# Patient Record
Sex: Female | Born: 1935
Health system: Southern US, Community
[De-identification: ages and names within clinical notes are randomized; demographics above are authoritative.]

## PROBLEM LIST (undated history)

## (undated) DIAGNOSIS — S62309A Unspecified fracture of unspecified metacarpal bone, initial encounter for closed fracture: Secondary | ICD-10-CM

## (undated) DIAGNOSIS — D649 Anemia, unspecified: Secondary | ICD-10-CM

## (undated) DIAGNOSIS — I219 Acute myocardial infarction, unspecified: Secondary | ICD-10-CM

## (undated) DIAGNOSIS — K5790 Diverticulosis of intestine, part unspecified, without perforation or abscess without bleeding: Secondary | ICD-10-CM

## (undated) DIAGNOSIS — K219 Gastro-esophageal reflux disease without esophagitis: Secondary | ICD-10-CM

## (undated) DIAGNOSIS — M1990 Unspecified osteoarthritis, unspecified site: Secondary | ICD-10-CM

## (undated) DIAGNOSIS — E785 Hyperlipidemia, unspecified: Secondary | ICD-10-CM

## (undated) DIAGNOSIS — I10 Essential (primary) hypertension: Secondary | ICD-10-CM

## (undated) DIAGNOSIS — C801 Malignant (primary) neoplasm, unspecified: Secondary | ICD-10-CM

## (undated) DIAGNOSIS — H25019 Cortical age-related cataract, unspecified eye: Secondary | ICD-10-CM

## (undated) DIAGNOSIS — I2510 Atherosclerotic heart disease of native coronary artery without angina pectoris: Secondary | ICD-10-CM

## (undated) DIAGNOSIS — I495 Sick sinus syndrome: Secondary | ICD-10-CM

## (undated) DIAGNOSIS — H9190 Unspecified hearing loss, unspecified ear: Secondary | ICD-10-CM

## (undated) DIAGNOSIS — M810 Age-related osteoporosis without current pathological fracture: Secondary | ICD-10-CM

## (undated) HISTORY — PX: CORONARY ANGIOPLASTY: SHX604

## (undated) HISTORY — PX: DILATION AND CURETTAGE OF UTERUS: SHX78

## (undated) HISTORY — PX: ABDOMINAL HYSTERECTOMY: SHX81

## (undated) HISTORY — PX: EYE SURGERY: SHX253

---

## 2004-08-24 ENCOUNTER — Ambulatory Visit: Payer: Self-pay | Admitting: Unknown Physician Specialty

## 2005-06-15 ENCOUNTER — Ambulatory Visit: Payer: Self-pay | Admitting: Internal Medicine

## 2006-04-07 ENCOUNTER — Emergency Department: Payer: Self-pay | Admitting: General Practice

## 2006-06-21 ENCOUNTER — Ambulatory Visit: Payer: Self-pay | Admitting: Internal Medicine

## 2006-08-06 ENCOUNTER — Encounter: Payer: Self-pay | Admitting: Internal Medicine

## 2006-08-16 ENCOUNTER — Encounter: Payer: Self-pay | Admitting: Internal Medicine

## 2007-02-10 ENCOUNTER — Emergency Department: Payer: Self-pay | Admitting: Internal Medicine

## 2007-02-10 ENCOUNTER — Other Ambulatory Visit: Payer: Self-pay

## 2007-11-13 ENCOUNTER — Inpatient Hospital Stay: Payer: Self-pay | Admitting: Specialist

## 2007-11-13 ENCOUNTER — Other Ambulatory Visit: Payer: Self-pay

## 2007-11-14 ENCOUNTER — Other Ambulatory Visit: Payer: Self-pay

## 2008-01-02 ENCOUNTER — Ambulatory Visit: Payer: Self-pay | Admitting: Family Medicine

## 2008-07-15 ENCOUNTER — Ambulatory Visit: Payer: Self-pay | Admitting: Family Medicine

## 2009-09-24 ENCOUNTER — Ambulatory Visit: Payer: Self-pay | Admitting: Family Medicine

## 2010-09-04 ENCOUNTER — Emergency Department: Payer: Self-pay | Admitting: Emergency Medicine

## 2011-03-01 ENCOUNTER — Ambulatory Visit: Payer: Self-pay | Admitting: Family Medicine

## 2012-03-20 ENCOUNTER — Ambulatory Visit: Payer: Self-pay | Admitting: Family Medicine

## 2013-05-29 ENCOUNTER — Ambulatory Visit: Payer: Self-pay | Admitting: Family Medicine

## 2014-07-29 ENCOUNTER — Ambulatory Visit: Payer: Self-pay | Admitting: Family Medicine

## 2015-07-20 ENCOUNTER — Other Ambulatory Visit: Payer: Self-pay | Admitting: Family Medicine

## 2015-07-20 DIAGNOSIS — Z1231 Encounter for screening mammogram for malignant neoplasm of breast: Secondary | ICD-10-CM

## 2015-08-03 ENCOUNTER — Ambulatory Visit
Admission: RE | Admit: 2015-08-03 | Discharge: 2015-08-03 | Disposition: A | Discharge status: Home / Self Care | Payer: Medicare Other | Source: Ambulatory Visit | Attending: Family Medicine | Admitting: Family Medicine

## 2015-08-03 DIAGNOSIS — Z1231 Encounter for screening mammogram for malignant neoplasm of breast: Secondary | ICD-10-CM | POA: Diagnosis present

## 2015-08-03 HISTORY — DX: Malignant (primary) neoplasm, unspecified: C80.1

## 2015-11-08 DIAGNOSIS — L820 Inflamed seborrheic keratosis: Secondary | ICD-10-CM | POA: Diagnosis not present

## 2015-11-08 DIAGNOSIS — L578 Other skin changes due to chronic exposure to nonionizing radiation: Secondary | ICD-10-CM | POA: Diagnosis not present

## 2015-11-08 DIAGNOSIS — L570 Actinic keratosis: Secondary | ICD-10-CM | POA: Diagnosis not present

## 2015-11-08 DIAGNOSIS — L821 Other seborrheic keratosis: Secondary | ICD-10-CM | POA: Diagnosis not present

## 2016-05-30 DIAGNOSIS — H2513 Age-related nuclear cataract, bilateral: Secondary | ICD-10-CM | POA: Diagnosis not present

## 2016-07-03 DIAGNOSIS — H2513 Age-related nuclear cataract, bilateral: Secondary | ICD-10-CM | POA: Diagnosis not present

## 2016-07-04 ENCOUNTER — Encounter: Payer: Self-pay | Admitting: *Deleted

## 2016-07-06 DIAGNOSIS — H04123 Dry eye syndrome of bilateral lacrimal glands: Secondary | ICD-10-CM | POA: Diagnosis not present

## 2016-07-10 ENCOUNTER — Encounter: Admission: RE | Payer: Self-pay | Source: Ambulatory Visit

## 2016-07-10 ENCOUNTER — Ambulatory Visit
Admission: RE | Admit: 2016-07-10 | Payer: Commercial Managed Care - HMO | Source: Ambulatory Visit | Admitting: Ophthalmology

## 2016-07-10 HISTORY — DX: Acute myocardial infarction, unspecified: I21.9

## 2016-07-10 HISTORY — DX: Atherosclerotic heart disease of native coronary artery without angina pectoris: I25.10

## 2016-07-10 HISTORY — DX: Essential (primary) hypertension: I10

## 2016-07-10 HISTORY — DX: Anemia, unspecified: D64.9

## 2016-07-10 SURGERY — PHACOEMULSIFICATION, CATARACT, WITH IOL INSERTION
Anesthesia: Choice | Laterality: Left

## 2016-07-28 DIAGNOSIS — H16123 Filamentary keratitis, bilateral: Secondary | ICD-10-CM | POA: Diagnosis not present

## 2016-08-21 ENCOUNTER — Other Ambulatory Visit: Payer: Self-pay | Admitting: Family Medicine

## 2016-08-21 DIAGNOSIS — K219 Gastro-esophageal reflux disease without esophagitis: Secondary | ICD-10-CM | POA: Diagnosis not present

## 2016-08-21 DIAGNOSIS — E782 Mixed hyperlipidemia: Secondary | ICD-10-CM | POA: Diagnosis not present

## 2016-08-21 DIAGNOSIS — Z1231 Encounter for screening mammogram for malignant neoplasm of breast: Secondary | ICD-10-CM | POA: Diagnosis not present

## 2016-08-21 DIAGNOSIS — I10 Essential (primary) hypertension: Secondary | ICD-10-CM | POA: Diagnosis not present

## 2016-08-21 DIAGNOSIS — Z23 Encounter for immunization: Secondary | ICD-10-CM | POA: Diagnosis not present

## 2016-08-28 DIAGNOSIS — H01003 Unspecified blepharitis right eye, unspecified eyelid: Secondary | ICD-10-CM | POA: Diagnosis not present

## 2016-09-04 ENCOUNTER — Ambulatory Visit
Admission: RE | Admit: 2016-09-04 | Discharge: 2016-09-04 | Disposition: A | Discharge status: Home / Self Care | Payer: Commercial Managed Care - HMO | Source: Ambulatory Visit | Attending: Family Medicine | Admitting: Family Medicine

## 2016-09-04 DIAGNOSIS — Z1231 Encounter for screening mammogram for malignant neoplasm of breast: Secondary | ICD-10-CM | POA: Insufficient documentation

## 2016-09-18 DIAGNOSIS — H16123 Filamentary keratitis, bilateral: Secondary | ICD-10-CM | POA: Diagnosis not present

## 2016-11-30 DIAGNOSIS — H04123 Dry eye syndrome of bilateral lacrimal glands: Secondary | ICD-10-CM | POA: Diagnosis not present

## 2016-12-08 DIAGNOSIS — H16123 Filamentary keratitis, bilateral: Secondary | ICD-10-CM | POA: Diagnosis not present

## 2017-01-01 DIAGNOSIS — H16223 Keratoconjunctivitis sicca, not specified as Sjogren's, bilateral: Secondary | ICD-10-CM | POA: Diagnosis not present

## 2017-02-09 DIAGNOSIS — H2513 Age-related nuclear cataract, bilateral: Secondary | ICD-10-CM | POA: Diagnosis not present

## 2017-02-20 DIAGNOSIS — J0100 Acute maxillary sinusitis, unspecified: Secondary | ICD-10-CM | POA: Diagnosis not present

## 2017-02-20 DIAGNOSIS — I10 Essential (primary) hypertension: Secondary | ICD-10-CM | POA: Diagnosis not present

## 2017-02-27 DIAGNOSIS — H2513 Age-related nuclear cataract, bilateral: Secondary | ICD-10-CM | POA: Diagnosis not present

## 2017-03-08 ENCOUNTER — Encounter: Payer: Self-pay | Admitting: *Deleted

## 2017-03-15 ENCOUNTER — Encounter: Payer: Self-pay | Admitting: *Deleted

## 2017-03-15 ENCOUNTER — Ambulatory Visit: Payer: Medicare HMO | Admitting: Anesthesiology

## 2017-03-15 ENCOUNTER — Ambulatory Visit
Admission: RE | Admit: 2017-03-15 | Discharge: 2017-03-15 | Disposition: A | Discharge status: Home / Self Care | Payer: Medicare HMO | Source: Ambulatory Visit | Attending: Ophthalmology | Admitting: Ophthalmology

## 2017-03-15 ENCOUNTER — Encounter
Admission: RE | Disposition: A | Discharge status: Home / Self Care | Payer: Self-pay | Source: Ambulatory Visit | Attending: Ophthalmology

## 2017-03-15 DIAGNOSIS — I252 Old myocardial infarction: Secondary | ICD-10-CM | POA: Insufficient documentation

## 2017-03-15 DIAGNOSIS — I10 Essential (primary) hypertension: Secondary | ICD-10-CM | POA: Insufficient documentation

## 2017-03-15 DIAGNOSIS — Z79899 Other long term (current) drug therapy: Secondary | ICD-10-CM | POA: Diagnosis not present

## 2017-03-15 DIAGNOSIS — H2513 Age-related nuclear cataract, bilateral: Secondary | ICD-10-CM | POA: Diagnosis not present

## 2017-03-15 DIAGNOSIS — I2510 Atherosclerotic heart disease of native coronary artery without angina pectoris: Secondary | ICD-10-CM | POA: Diagnosis not present

## 2017-03-15 DIAGNOSIS — E7800 Pure hypercholesterolemia, unspecified: Secondary | ICD-10-CM | POA: Diagnosis not present

## 2017-03-15 DIAGNOSIS — Z7982 Long term (current) use of aspirin: Secondary | ICD-10-CM | POA: Insufficient documentation

## 2017-03-15 DIAGNOSIS — H2512 Age-related nuclear cataract, left eye: Secondary | ICD-10-CM | POA: Insufficient documentation

## 2017-03-15 DIAGNOSIS — Z955 Presence of coronary angioplasty implant and graft: Secondary | ICD-10-CM | POA: Diagnosis not present

## 2017-03-15 DIAGNOSIS — D649 Anemia, unspecified: Secondary | ICD-10-CM | POA: Diagnosis not present

## 2017-03-15 HISTORY — DX: Unspecified hearing loss, unspecified ear: H91.90

## 2017-03-15 HISTORY — PX: CATARACT EXTRACTION W/PHACO: SHX586

## 2017-03-15 HISTORY — DX: Unspecified osteoarthritis, unspecified site: M19.90

## 2017-03-15 SURGERY — PHACOEMULSIFICATION, CATARACT, WITH IOL INSERTION
Anesthesia: Monitor Anesthesia Care | Site: Eye | Laterality: Left | Wound class: Clean

## 2017-03-15 MED ORDER — MIDAZOLAM HCL 2 MG/2ML IJ SOLN
INTRAMUSCULAR | Status: DC | PRN
  Administered 2017-03-15: 1 mg via INTRAVENOUS

## 2017-03-15 MED ORDER — SODIUM HYALURONATE 10 MG/ML IO SOLN
INTRAOCULAR | Status: AC
  Filled 2017-03-15: qty 0.85

## 2017-03-15 MED ORDER — LIDOCAINE HCL (PF) 4 % IJ SOLN
INTRAOCULAR | Status: DC | PRN
  Administered 2017-03-15: 4 mL via OPHTHALMIC

## 2017-03-15 MED ORDER — FENTANYL CITRATE (PF) 100 MCG/2ML IJ SOLN: 25.0000 ug | INTRAMUSCULAR | Status: DC | PRN

## 2017-03-15 MED ORDER — CARBACHOL 0.01 % IO SOLN
INTRAOCULAR | Status: DC | PRN
  Administered 2017-03-15: 0.5 mL via INTRAOCULAR

## 2017-03-15 MED ORDER — ARMC OPHTHALMIC DILATING DROPS
1.0000 "application " | OPHTHALMIC | Status: AC
  Administered 2017-03-15 (×2): 1 via OPHTHALMIC

## 2017-03-15 MED ORDER — TRYPAN BLUE 0.06 % OP SOLN
OPHTHALMIC | Status: DC | PRN
  Administered 2017-03-15: 0.5 mL via INTRAOCULAR

## 2017-03-15 MED ORDER — MOXIFLOXACIN HCL 0.5 % OP SOLN
OPHTHALMIC | Status: AC
  Filled 2017-03-15: qty 3

## 2017-03-15 MED ORDER — MOXIFLOXACIN HCL 0.5 % OP SOLN: 1.0000 [drp] | OPHTHALMIC | Status: DC | PRN

## 2017-03-15 MED ORDER — SODIUM HYALURONATE 23 MG/ML IO SOLN
INTRAOCULAR | Status: DC | PRN
  Administered 2017-03-15: 0.6 mL via INTRAOCULAR

## 2017-03-15 MED ORDER — POVIDONE-IODINE 5 % OP SOLN
OPHTHALMIC | Status: DC | PRN
  Administered 2017-03-15: 1 via OPHTHALMIC

## 2017-03-15 MED ORDER — POVIDONE-IODINE 5 % OP SOLN
OPHTHALMIC | Status: AC
  Filled 2017-03-15: qty 30

## 2017-03-15 MED ORDER — SODIUM HYALURONATE 10 MG/ML IO SOLN
INTRAOCULAR | Status: DC | PRN
  Administered 2017-03-15: 0.85 mL via INTRAOCULAR

## 2017-03-15 MED ORDER — SODIUM CHLORIDE 0.9 % IV SOLN
INTRAVENOUS | Status: DC
  Administered 2017-03-15: 09:00:00 via INTRAVENOUS

## 2017-03-15 MED ORDER — ONDANSETRON HCL 4 MG/2ML IJ SOLN: 4.0000 mg | Freq: Once | INTRAMUSCULAR | Status: DC | PRN

## 2017-03-15 MED ORDER — MOXIFLOXACIN HCL 0.5 % OP SOLN
OPHTHALMIC | Status: DC | PRN
  Administered 2017-03-15: 1 [drp] via OPHTHALMIC

## 2017-03-15 MED ORDER — SODIUM HYALURONATE 23 MG/ML IO SOLN
INTRAOCULAR | Status: AC
  Filled 2017-03-15: qty 0.6

## 2017-03-15 MED ORDER — EPINEPHRINE PF 1 MG/ML IJ SOLN
INTRAOCULAR | Status: DC | PRN
  Administered 2017-03-15: 200 mL via OPHTHALMIC

## 2017-03-15 MED ORDER — EPINEPHRINE PF 1 MG/ML IJ SOLN
INTRAMUSCULAR | Status: AC
  Filled 2017-03-15: qty 2

## 2017-03-15 MED ORDER — ARMC OPHTHALMIC DILATING DROPS
OPHTHALMIC | Status: AC
  Filled 2017-03-15: qty 0.4

## 2017-03-15 MED ORDER — LIDOCAINE HCL (PF) 2 % IJ SOLN
INTRAMUSCULAR | Status: AC
  Filled 2017-03-15: qty 4

## 2017-03-15 MED ORDER — FENTANYL CITRATE (PF) 100 MCG/2ML IJ SOLN
INTRAMUSCULAR | Status: AC
  Filled 2017-03-15: qty 2

## 2017-03-15 MED ORDER — MIDAZOLAM HCL 2 MG/2ML IJ SOLN
INTRAMUSCULAR | Status: AC
  Filled 2017-03-15: qty 2

## 2017-03-15 SURGICAL SUPPLY — 15 items
DISSECTOR HYDRO NUCLEUS 50X22 (MISCELLANEOUS) ×3 IMPLANT
GLOVE BIO SURGEON STRL SZ8 (GLOVE) ×3 IMPLANT
GLOVE BIOGEL M 6.5 STRL (GLOVE) ×3 IMPLANT
GLOVE SURG LX 7.5 STRW (GLOVE) ×2
GLOVE SURG LX STRL 7.5 STRW (GLOVE) ×1 IMPLANT
GOWN STRL REUS W/ TWL LRG LVL3 (GOWN DISPOSABLE) ×2 IMPLANT
GOWN STRL REUS W/TWL LRG LVL3 (GOWN DISPOSABLE) ×4
LENS IOL TECNIS ITEC 26.5 (Intraocular Lens) ×3 IMPLANT
PACK CATARACT (MISCELLANEOUS) ×3 IMPLANT
PACK CATARACT KING (MISCELLANEOUS) ×3 IMPLANT
PACK EYE AFTER SURG (MISCELLANEOUS) ×3 IMPLANT
SOL BSS BAG (MISCELLANEOUS) ×3
SOLUTION BSS BAG (MISCELLANEOUS) ×1 IMPLANT
WATER STERILE IRR 250ML POUR (IV SOLUTION) ×3 IMPLANT
WIPE NON LINTING 3.25X3.25 (MISCELLANEOUS) ×3 IMPLANT

## 2017-03-15 NOTE — Anesthesia Postprocedure Evaluation (Signed)
Anesthesia Post Note  Patient: Lisa Gonzales  Procedure(s) Performed: Procedure(s) (LRB): CATARACT EXTRACTION PHACO AND INTRAOCULAR LENS PLACEMENT (IOC) (Left)  Patient location during evaluation: PACU Anesthesia Type: MAC Level of consciousness: awake and alert Pain management: pain level controlled Vital Signs Assessment: post-procedure vital signs reviewed and stable Respiratory status: spontaneous breathing, nonlabored ventilation, respiratory function stable and patient connected to nasal cannula oxygen Cardiovascular status: stable and blood pressure returned to baseline Anesthetic complications: no     Last Vitals:  Vitals:   03/15/17 0844 03/15/17 0845  BP: (!) 169/78   Pulse: (!) 52   Resp: 18   Temp: 36.7 C 36.9 C    Last Pain:  Vitals:   03/15/17 0845  TempSrc: Oral                 Nadene Rubins

## 2017-03-15 NOTE — Transfer of Care (Signed)
Immediate Anesthesia Transfer of Care Note  Patient: Lisa Gonzales  Procedure(s) Performed: Procedure(s) with comments: CATARACT EXTRACTION PHACO AND INTRAOCULAR LENS PLACEMENT (IOC) (Left) - US00:35.5 AP8.6 CDE3.06 FLUID LOT # [id number]  Patient Location: PACU  Anesthesia Type:MAC  Level of Consciousness: awake, alert  and oriented  Airway & Oxygen Therapy: Patient Spontanous Breathing  Post-op Assessment: Report given to RN and Post -op Vital signs reviewed and stable  Post vital signs: Reviewed and stable  Last Vitals:  Vitals:   03/15/17 0844 03/15/17 0845  BP: (!) 169/78   Pulse: (!) 52   Resp: 18   Temp: 36.7 C 36.9 C    Last Pain:  Vitals:   03/15/17 0845  TempSrc: Oral         Complications: No apparent anesthesia complications

## 2017-03-15 NOTE — Op Note (Signed)
OPERATIVE NOTE  Lisa Gonzales [id number] 03/15/2017   PREOPERATIVE DIAGNOSIS:  Nuclear sclerotic cataract left eye.  H25.12   POSTOPERATIVE DIAGNOSIS:    Nuclear sclerotic cataract left eye.     PROCEDURE:  Phacoemusification with posterior chamber intraocular lens placement of the left eye   LENS:   Implant Name Type Inv. Item Serial No. Manufacturer Lot No. LRB No. Used  LENS IOL DIOP 26.5 - [id number] Intraocular Lens LENS IOL DIOP 26.5 [id number]   Left 1       PCB00 +26.5   ULTRASOUND TIME: 0 minutes 35 seconds.  CDE 3.06   SURGEON:  Benay Pillow, MD, MPH   ANESTHESIA:  Topical with tetracaine drops augmented with 1% preservative-free intracameral lidocaine.  ESTIMATED BLOOD LOSS: <1 mL   COMPLICATIONS:  None.   DESCRIPTION OF PROCEDURE:  The patient was identified in the holding room and transported to the operating room and placed in the supine position under the operating microscope.  The left eye was identified as the operative eye and it was prepped and draped in the usual sterile ophthalmic fashion.   A 1.0 millimeter clear-corneal paracentesis was made at the 5:00 position. 0.5 ml of preservative-free 1% lidocaine with epinephrine was injected into the anterior chamber.  The anterior chamber was filled with Healon 5 viscoelastic.  A 2.4 millimeter keratome was used to make a near-clear corneal incision at the 2:00 position.  A curvilinear capsulorrhexis was made with a cystotome and capsulorrhexis forceps.  Balanced salt solution was used to hydrodissect and hydrodelineate the nucleus.   Phacoemulsification was then used in stop and chop fashion to remove the lens nucleus and epinucleus.  The remaining cortex was then removed using the irrigation and aspiration handpiece. Healon was then placed into the capsular bag to distend it for lens placement.  A lens was then injected into the capsular bag.  The remaining viscoelastic was aspirated.   Wounds were  hydrated with balanced salt solution.  The anterior chamber was inflated to a physiologic pressure with balanced salt solution.  Intracameral vigamox 0.1 mL undiltued was injected into the eye and a drop placed onto the ocular surface.  No wound leaks were noted.  The patient was taken to the recovery room in stable condition without complications of anesthesia or surgery  Benay Pillow 03/15/2017, 10:34 AM

## 2017-03-15 NOTE — Anesthesia Post-op Follow-up Note (Cosign Needed)
Anesthesia QCDR form completed.        

## 2017-03-15 NOTE — Discharge Instructions (Signed)
Eye Surgery Discharge Instructions  Expect mild scratchy sensation or mild soreness. DO NOT RUB YOUR EYE!  The day of surgery:  Minimal physical activity, but bed rest is not required  No reading, computer work, or close hand work  No bending, lifting, or straining.  May watch TV  For 24 hours:  No driving, legal decisions, or alcoholic beverages  Safety precautions  Eat anything you prefer: It is better to start with liquids, then soup then solid foods.  _____ Eye patch should be worn until postoperative exam tomorrow.  ____ Solar shield eyeglasses should be worn for comfort in the sunlight/patch while sleeping  Resume all regular medications including aspirin or Coumadin if these were discontinued prior to surgery. You may shower, bathe, shave, or wash your hair. Tylenol may be taken for mild discomfort.  Call your doctor if you experience significant pain, nausea, or vomiting, fever > 101 or other signs of infection. [phone] or [phone] Specific instructions:  Follow-up Information    Eulogio Bear, MD Follow up on 03/16/2017.   Specialty:  Ophthalmology Why:  9:25 Contact information: [address] [place] [address] [phone]

## 2017-03-15 NOTE — H&P (Signed)
The History and Physical notes are on paper, have been signed, and are to be scanned.   I have examined the patient and there are no changes to the H&P.   Benay Pillow 03/15/2017 9:59 AM

## 2017-03-15 NOTE — Anesthesia Preprocedure Evaluation (Addendum)
Anesthesia Evaluation  Patient identified by MRN, date of birth, ID band Patient awake    Reviewed: Allergy & Precautions, NPO status , Patient's Chart, lab work & pertinent test results, reviewed documented beta blocker date and time   Airway Mallampati: III  TM Distance: <3 FB     Dental  (+) Lower Dentures   Pulmonary neg pulmonary ROS,    Pulmonary exam normal        Cardiovascular hypertension, Pt. on medications and Pt. on home beta blockers + CAD and + Past MI  Normal cardiovascular exam     Neuro/Psych negative neurological ROS  negative psych ROS   GI/Hepatic negative GI ROS, Neg liver ROS,   Endo/Other  negative endocrine ROS  Renal/GU negative Renal ROS     Musculoskeletal  (+) Arthritis , Osteoarthritis,    Abdominal Normal abdominal exam  (+)   Peds negative pediatric ROS (+)  Hematology  (+) anemia ,   Anesthesia Other Findings Past Medical History: No date: Anemia No date: Arthritis No date: Cancer (HCC)     Comment: skin ca No date: Coronary artery disease No date: HOH (hard of hearing)     Comment: AIDS No date: Hypertension No date: Myocardial infarction [facility])     Comment: 2009  Reproductive/Obstetrics                            Anesthesia Physical Anesthesia Plan  ASA: III  Anesthesia Plan: MAC   Post-op Pain Management:    Induction: Intravenous  Airway Management Planned: Nasal Cannula  Additional Equipment:   Intra-op Plan:   Post-operative Plan:   Informed Consent: I have reviewed the patients History and Physical, chart, labs and discussed the procedure including the risks, benefits and alternatives for the proposed anesthesia with the patient or authorized representative who has indicated his/her understanding and acceptance.   Dental advisory given  Plan Discussed with: CRNA and Surgeon  Anesthesia Plan Comments:         Anesthesia  Quick Evaluation

## 2017-03-30 DIAGNOSIS — R69 Illness, unspecified: Secondary | ICD-10-CM | POA: Diagnosis not present

## 2017-04-05 DIAGNOSIS — R69 Illness, unspecified: Secondary | ICD-10-CM | POA: Diagnosis not present

## 2017-04-05 DIAGNOSIS — K006 Disturbances in tooth eruption: Secondary | ICD-10-CM | POA: Diagnosis not present

## 2017-04-26 DIAGNOSIS — H2511 Age-related nuclear cataract, right eye: Secondary | ICD-10-CM | POA: Diagnosis not present

## 2017-05-02 ENCOUNTER — Encounter: Payer: Self-pay | Admitting: *Deleted

## 2017-05-10 ENCOUNTER — Ambulatory Visit: Payer: Medicare HMO | Admitting: Anesthesiology

## 2017-05-10 ENCOUNTER — Encounter
Admission: RE | Disposition: A | Discharge status: Home / Self Care | Payer: Self-pay | Source: Ambulatory Visit | Attending: Ophthalmology

## 2017-05-10 ENCOUNTER — Ambulatory Visit
Admission: RE | Admit: 2017-05-10 | Discharge: 2017-05-10 | Disposition: A | Discharge status: Home / Self Care | Payer: Medicare HMO | Source: Ambulatory Visit | Attending: Ophthalmology | Admitting: Ophthalmology

## 2017-05-10 ENCOUNTER — Encounter: Payer: Self-pay | Admitting: *Deleted

## 2017-05-10 DIAGNOSIS — M1990 Unspecified osteoarthritis, unspecified site: Secondary | ICD-10-CM | POA: Insufficient documentation

## 2017-05-10 DIAGNOSIS — I2510 Atherosclerotic heart disease of native coronary artery without angina pectoris: Secondary | ICD-10-CM | POA: Diagnosis not present

## 2017-05-10 DIAGNOSIS — Z955 Presence of coronary angioplasty implant and graft: Secondary | ICD-10-CM | POA: Insufficient documentation

## 2017-05-10 DIAGNOSIS — E7800 Pure hypercholesterolemia, unspecified: Secondary | ICD-10-CM | POA: Insufficient documentation

## 2017-05-10 DIAGNOSIS — I10 Essential (primary) hypertension: Secondary | ICD-10-CM | POA: Diagnosis not present

## 2017-05-10 DIAGNOSIS — Z7982 Long term (current) use of aspirin: Secondary | ICD-10-CM | POA: Diagnosis not present

## 2017-05-10 DIAGNOSIS — H2511 Age-related nuclear cataract, right eye: Secondary | ICD-10-CM | POA: Diagnosis not present

## 2017-05-10 DIAGNOSIS — I252 Old myocardial infarction: Secondary | ICD-10-CM | POA: Insufficient documentation

## 2017-05-10 DIAGNOSIS — Z85828 Personal history of other malignant neoplasm of skin: Secondary | ICD-10-CM | POA: Insufficient documentation

## 2017-05-10 DIAGNOSIS — Z79899 Other long term (current) drug therapy: Secondary | ICD-10-CM | POA: Insufficient documentation

## 2017-05-10 HISTORY — PX: CATARACT EXTRACTION W/PHACO: SHX586

## 2017-05-10 SURGERY — PHACOEMULSIFICATION, CATARACT, WITH IOL INSERTION
Anesthesia: Monitor Anesthesia Care | Site: Eye | Laterality: Right | Wound class: Clean

## 2017-05-10 MED ORDER — MOXIFLOXACIN HCL 0.5 % OP SOLN: 1.0000 [drp] | OPHTHALMIC | Status: DC | PRN

## 2017-05-10 MED ORDER — SODIUM HYALURONATE 10 MG/ML IO SOLN
INTRAOCULAR | Status: DC | PRN
  Administered 2017-05-10: 0.55 mL via INTRAOCULAR

## 2017-05-10 MED ORDER — POVIDONE-IODINE 5 % OP SOLN
OPHTHALMIC | Status: AC
  Filled 2017-05-10: qty 30

## 2017-05-10 MED ORDER — SODIUM CHLORIDE 0.9 % IV SOLN
INTRAVENOUS | Status: DC
  Administered 2017-05-10 (×2): via INTRAVENOUS

## 2017-05-10 MED ORDER — LIDOCAINE HCL (PF) 4 % IJ SOLN
INTRAOCULAR | Status: DC | PRN
  Administered 2017-05-10: 4 mL via OPHTHALMIC

## 2017-05-10 MED ORDER — POVIDONE-IODINE 5 % OP SOLN
OPHTHALMIC | Status: DC | PRN
  Administered 2017-05-10: 1 via OPHTHALMIC

## 2017-05-10 MED ORDER — FENTANYL CITRATE (PF) 100 MCG/2ML IJ SOLN
INTRAMUSCULAR | Status: DC | PRN
Start: 2017-05-10 — End: 2017-05-10
  Administered 2017-05-10: 50 ug via INTRAVENOUS

## 2017-05-10 MED ORDER — MOXIFLOXACIN HCL 0.5 % OP SOLN
OPHTHALMIC | Status: AC
  Filled 2017-05-10: qty 3

## 2017-05-10 MED ORDER — SODIUM HYALURONATE 23 MG/ML IO SOLN
INTRAOCULAR | Status: AC
  Filled 2017-05-10: qty 0.6

## 2017-05-10 MED ORDER — SODIUM HYALURONATE 23 MG/ML IO SOLN
INTRAOCULAR | Status: DC | PRN
  Administered 2017-05-10: 0.6 mL via INTRAOCULAR

## 2017-05-10 MED ORDER — FENTANYL CITRATE (PF) 100 MCG/2ML IJ SOLN
INTRAMUSCULAR | Status: AC
  Filled 2017-05-10: qty 2

## 2017-05-10 MED ORDER — ARMC OPHTHALMIC DILATING DROPS
1.0000 "application " | OPHTHALMIC | Status: AC
  Administered 2017-05-10 (×3): 1 via OPHTHALMIC

## 2017-05-10 MED ORDER — ARMC OPHTHALMIC DILATING DROPS
OPHTHALMIC | Status: AC
  Administered 2017-05-10: 1 via OPHTHALMIC
  Filled 2017-05-10: qty 0.4

## 2017-05-10 MED ORDER — EPINEPHRINE PF 1 MG/ML IJ SOLN
INTRAMUSCULAR | Status: AC
  Filled 2017-05-10: qty 1

## 2017-05-10 MED ORDER — BSS IO SOLN
INTRAOCULAR | Status: DC | PRN
  Administered 2017-05-10: 200 mL via INTRAOCULAR

## 2017-05-10 MED ORDER — LIDOCAINE HCL (PF) 4 % IJ SOLN
INTRAMUSCULAR | Status: AC
  Filled 2017-05-10: qty 5

## 2017-05-10 MED ORDER — MOXIFLOXACIN HCL 0.5 % OP SOLN
OPHTHALMIC | Status: DC | PRN
  Administered 2017-05-10: 0.2 mL via OPHTHALMIC

## 2017-05-10 SURGICAL SUPPLY — 16 items
DISSECTOR HYDRO NUCLEUS 50X22 (MISCELLANEOUS) ×3 IMPLANT
GLOVE BIO SURGEON STRL SZ8 (GLOVE) ×3 IMPLANT
GLOVE BIOGEL M 6.5 STRL (GLOVE) ×3 IMPLANT
GLOVE SURG LX 7.5 STRW (GLOVE) ×2
GLOVE SURG LX STRL 7.5 STRW (GLOVE) ×1 IMPLANT
GOWN STRL REUS W/ TWL LRG LVL3 (GOWN DISPOSABLE) ×2 IMPLANT
GOWN STRL REUS W/TWL LRG LVL3 (GOWN DISPOSABLE) ×4
LABEL CATARACT MEDS ST (LABEL) ×3 IMPLANT
LENS IOL TECNIS ITEC 26.0 (Intraocular Lens) ×3 IMPLANT
PACK CATARACT (MISCELLANEOUS) ×3 IMPLANT
PACK CATARACT KING (MISCELLANEOUS) ×3 IMPLANT
PACK EYE AFTER SURG (MISCELLANEOUS) ×3 IMPLANT
SOL BSS BAG (MISCELLANEOUS) ×3
SOLUTION BSS BAG (MISCELLANEOUS) ×1 IMPLANT
WATER STERILE IRR 250ML POUR (IV SOLUTION) ×3 IMPLANT
WIPE NON LINTING 3.25X3.25 (MISCELLANEOUS) ×3 IMPLANT

## 2017-05-10 NOTE — Op Note (Signed)
OPERATIVE NOTE  Lisa Gonzales [id number] 05/10/2017   PREOPERATIVE DIAGNOSIS:  Nuclear sclerotic cataract right eye.  H25.11   POSTOPERATIVE DIAGNOSIS:    Nuclear sclerotic cataract right eye.     PROCEDURE:  Phacoemusification with posterior chamber intraocular lens placement of the right eye   LENS:   Implant Name Type Inv. Item Serial No. Manufacturer Lot No. LRB No. Used  LENS IOL DIOP 26.0 - [id number] Intraocular Lens LENS IOL DIOP 26.0 [id number] AMO   Right 1       PCB00 +26.0   ULTRASOUND TIME: 0 minutes 42 seconds.  CDE 3.89   SURGEON:  Benay Pillow, MD, MPH  ANESTHESIOLOGIST: Anesthesiologist: [employer] [facility], Jane Canary, MD CRNA: Allean Found, CRNA   ANESTHESIA:  Topical with tetracaine drops augmented with 1% preservative-free intracameral lidocaine.  ESTIMATED BLOOD LOSS: less than 1 mL.   COMPLICATIONS:  None.   DESCRIPTION OF PROCEDURE:  The patient was identified in the holding room and transported to the operating room and placed in the supine position under the operating microscope.  The right eye was identified as the operative eye and it was prepped and draped in the usual sterile ophthalmic fashion.   A 1.0 millimeter clear-corneal paracentesis was made at the 10:30 position. 0.5 ml of preservative-free 1% lidocaine with epinephrine was injected into the anterior chamber.  The anterior chamber was filled with Healon 5 viscoelastic.  A 2.4 millimeter keratome was used to make a near-clear corneal incision at the 8:00 position.  A curvilinear capsulorrhexis was made with a cystotome and capsulorrhexis forceps.  Balanced salt solution was used to hydrodissect and hydrodelineate the nucleus.   Phacoemulsification was then used in stop and chop fashion to remove the lens nucleus and epinucleus.  The remaining cortex was then removed using the irrigation and aspiration handpiece. Healon was then placed into the capsular bag to distend it for lens  placement.  A lens was then injected into the capsular bag.  The remaining viscoelastic was aspirated.   Wounds were hydrated with balanced salt solution.  The anterior chamber was inflated to a physiologic pressure with balanced salt solution.   Intracameral vigamox 0.1 mL undiluted was injected into the eye and a drop placed onto the ocular surface.  No wound leaks were noted.  The patient was taken to the recovery room in stable condition without complications of anesthesia or surgery  Benay Pillow 05/10/2017, 10:43 AM

## 2017-05-10 NOTE — Transfer of Care (Signed)
Immediate Anesthesia Transfer of Care Note  Patient: Lisa Gonzales  Procedure(s) Performed: Procedure(s) with comments: CATARACT EXTRACTION PHACO AND INTRAOCULAR LENS PLACEMENT (IOC) (Right) - Lot # [id number] H [place]: 00:42.8 AP%: 9.1 CDE: 3.89  Patient Location: PACU  Anesthesia Type:MAC  Level of Consciousness: awake  Airway & Oxygen Therapy: Patient Spontanous Breathing  Post-op Assessment: Report given to RN and Post -op Vital signs reviewed and stable  Post vital signs: Reviewed and stable  Last Vitals:  Vitals:   05/10/17 0855  BP: 138/60  Pulse: (!) 50  Resp: 20  Temp: (!) 36.1 C    Last Pain:  Vitals:   05/10/17 0855  TempSrc: Tympanic      Patients Stated Pain Goal: 0 (08/67/61 9509)  Complications: No apparent anesthesia complications

## 2017-05-10 NOTE — Anesthesia Preprocedure Evaluation (Signed)
Anesthesia Evaluation  Patient identified by MRN, date of birth, ID band Patient awake    Reviewed: Allergy & Precautions  Airway Mallampati: II       Dental  (+) Teeth Intact   Pulmonary neg pulmonary ROS,    breath sounds clear to auscultation       Cardiovascular Exercise Tolerance: Good hypertension, Pt. on home beta blockers + CAD and + Past MI   Rhythm:Regular     Neuro/Psych negative neurological ROS  negative psych ROS   GI/Hepatic negative GI ROS, Neg liver ROS,   Endo/Other  negative endocrine ROS  Renal/GU negative Renal ROS     Musculoskeletal   Abdominal Normal abdominal exam  (+)   Peds negative pediatric ROS (+)  Hematology  (+) anemia ,   Anesthesia Other Findings   Reproductive/Obstetrics                             Anesthesia Physical Anesthesia Plan  ASA: II  Anesthesia Plan: MAC   Post-op Pain Management:    Induction: Intravenous  PONV Risk Score and Plan: 0  Airway Management Planned: Natural Airway and Nasal Cannula  Additional Equipment:   Intra-op Plan:   Post-operative Plan:   Informed Consent: I have reviewed the patients History and Physical, chart, labs and discussed the procedure including the risks, benefits and alternatives for the proposed anesthesia with the patient or authorized representative who has indicated his/her understanding and acceptance.     Plan Discussed with: CRNA  Anesthesia Plan Comments:         Anesthesia Quick Evaluation

## 2017-05-10 NOTE — Discharge Instructions (Signed)
Eye Surgery Discharge Instructions  Expect mild scratchy sensation or mild soreness. DO NOT RUB YOUR EYE!  The day of surgery:  Minimal physical activity, but bed rest is not required  No reading, computer work, or close hand work  No bending, lifting, or straining.  May watch TV  For 24 hours:  No driving, legal decisions, or alcoholic beverages  Safety precautions  Eat anything you prefer: It is better to start with liquids, then soup then solid foods.  _____ Eye patch should be worn until postoperative exam tomorrow.  ____ Solar shield eyeglasses should be worn for comfort in the sunlight/patch while sleeping  Resume all regular medications including aspirin or Coumadin if these were discontinued prior to surgery. You may shower, bathe, shave, or wash your hair. Tylenol may be taken for mild discomfort.  Call your doctor if you experience significant pain, nausea, or vomiting, fever > 101 or other signs of infection. [phone] or [phone] Specific instructions:  Follow-up Information    Birder Robson, MD Follow up.   Specialty:  Ophthalmology Why:  July 27 at 8:50am at [facility] information: [address] [place] [address] [phone]

## 2017-05-10 NOTE — Anesthesia Post-op Follow-up Note (Cosign Needed)
Anesthesia QCDR form completed.        

## 2017-05-10 NOTE — Anesthesia Postprocedure Evaluation (Signed)
Anesthesia Post Note  Patient: Lisa Gonzales  Procedure(s) Performed: Procedure(s) (LRB): CATARACT EXTRACTION PHACO AND INTRAOCULAR LENS PLACEMENT (IOC) (Right)  Patient location during evaluation: Short Stay Anesthesia Type: MAC Level of consciousness: awake Pain management: pain level controlled Vital Signs Assessment: post-procedure vital signs reviewed and stable Respiratory status: spontaneous breathing Cardiovascular status: blood pressure returned to baseline and stable Postop Assessment: no headache Anesthetic complications: no     Last Vitals:  Vitals:   05/10/17 0855 05/10/17 1044  BP: 138/60 (!) 150/58  Pulse: (!) 50 (!) 51  Resp: 20 18  Temp: (!) 36.1 C 36.6 C    Last Pain:  Vitals:   05/10/17 0855  TempSrc: Tympanic                 Buckner Malta

## 2017-05-10 NOTE — Anesthesia Procedure Notes (Signed)
Procedure Name: MAC Date/Time: 05/10/2017 10:20 AM Performed by: Allean Found Pre-anesthesia Checklist: Patient identified, Emergency Drugs available, Suction available, Patient being monitored and Timeout performed Patient Re-evaluated:Patient Re-evaluated prior to induction Oxygen Delivery Method: Nasal cannula Placement Confirmation: positive ETCO2

## 2017-05-10 NOTE — H&P (Signed)
The History and Physical notes are on paper, have been signed, and are to be scanned.   I have examined the patient and there are no changes to the H&P.   Lisa Gonzales 05/10/2017 10:04 AM

## 2017-07-23 DIAGNOSIS — Z961 Presence of intraocular lens: Secondary | ICD-10-CM | POA: Diagnosis not present

## 2017-08-10 DIAGNOSIS — R69 Illness, unspecified: Secondary | ICD-10-CM | POA: Diagnosis not present

## 2017-08-22 DIAGNOSIS — R69 Illness, unspecified: Secondary | ICD-10-CM | POA: Diagnosis not present

## 2017-10-03 ENCOUNTER — Other Ambulatory Visit: Payer: Self-pay | Admitting: Family Medicine

## 2017-10-03 DIAGNOSIS — Z8261 Family history of arthritis: Secondary | ICD-10-CM | POA: Diagnosis not present

## 2017-10-03 DIAGNOSIS — Z23 Encounter for immunization: Secondary | ICD-10-CM | POA: Diagnosis not present

## 2017-10-03 DIAGNOSIS — K219 Gastro-esophageal reflux disease without esophagitis: Secondary | ICD-10-CM | POA: Diagnosis not present

## 2017-10-03 DIAGNOSIS — M19049 Primary osteoarthritis, unspecified hand: Secondary | ICD-10-CM | POA: Diagnosis not present

## 2017-10-03 DIAGNOSIS — Z79899 Other long term (current) drug therapy: Secondary | ICD-10-CM | POA: Diagnosis not present

## 2017-10-03 DIAGNOSIS — Z1231 Encounter for screening mammogram for malignant neoplasm of breast: Secondary | ICD-10-CM | POA: Diagnosis not present

## 2017-10-03 DIAGNOSIS — I10 Essential (primary) hypertension: Secondary | ICD-10-CM | POA: Diagnosis not present

## 2017-10-03 DIAGNOSIS — Z1239 Encounter for other screening for malignant neoplasm of breast: Secondary | ICD-10-CM

## 2017-10-03 DIAGNOSIS — E782 Mixed hyperlipidemia: Secondary | ICD-10-CM | POA: Diagnosis not present

## 2017-10-03 DIAGNOSIS — I2510 Atherosclerotic heart disease of native coronary artery without angina pectoris: Secondary | ICD-10-CM | POA: Diagnosis not present

## 2017-12-18 ENCOUNTER — Ambulatory Visit
Admission: RE | Admit: 2017-12-18 | Discharge: 2017-12-18 | Disposition: A | Discharge status: Home / Self Care | Payer: Medicare HMO | Source: Ambulatory Visit | Attending: Family Medicine | Admitting: Family Medicine

## 2017-12-18 DIAGNOSIS — Z1231 Encounter for screening mammogram for malignant neoplasm of breast: Secondary | ICD-10-CM | POA: Diagnosis not present

## 2017-12-18 DIAGNOSIS — Z1239 Encounter for other screening for malignant neoplasm of breast: Secondary | ICD-10-CM

## 2018-01-23 DIAGNOSIS — R768 Other specified abnormal immunological findings in serum: Secondary | ICD-10-CM | POA: Diagnosis not present

## 2018-01-23 DIAGNOSIS — M79642 Pain in left hand: Secondary | ICD-10-CM | POA: Diagnosis not present

## 2018-01-23 DIAGNOSIS — Z8261 Family history of arthritis: Secondary | ICD-10-CM | POA: Diagnosis not present

## 2018-01-23 DIAGNOSIS — M20002 Unspecified deformity of left finger(s): Secondary | ICD-10-CM | POA: Diagnosis not present

## 2018-02-12 DIAGNOSIS — M25542 Pain in joints of left hand: Secondary | ICD-10-CM | POA: Diagnosis not present

## 2018-02-12 DIAGNOSIS — M19042 Primary osteoarthritis, left hand: Secondary | ICD-10-CM | POA: Diagnosis not present

## 2018-02-12 DIAGNOSIS — R768 Other specified abnormal immunological findings in serum: Secondary | ICD-10-CM | POA: Diagnosis not present

## 2018-02-12 DIAGNOSIS — M19041 Primary osteoarthritis, right hand: Secondary | ICD-10-CM | POA: Diagnosis not present

## 2018-02-12 DIAGNOSIS — M25541 Pain in joints of right hand: Secondary | ICD-10-CM | POA: Diagnosis not present

## 2018-02-28 DIAGNOSIS — M19042 Primary osteoarthritis, left hand: Secondary | ICD-10-CM | POA: Diagnosis not present

## 2018-02-28 DIAGNOSIS — M3501 Sicca syndrome with keratoconjunctivitis: Secondary | ICD-10-CM | POA: Diagnosis not present

## 2018-02-28 DIAGNOSIS — Z79899 Other long term (current) drug therapy: Secondary | ICD-10-CM | POA: Diagnosis not present

## 2018-02-28 DIAGNOSIS — M19041 Primary osteoarthritis, right hand: Secondary | ICD-10-CM | POA: Diagnosis not present

## 2018-02-28 DIAGNOSIS — R768 Other specified abnormal immunological findings in serum: Secondary | ICD-10-CM | POA: Diagnosis not present

## 2018-02-28 DIAGNOSIS — M0579 Rheumatoid arthritis with rheumatoid factor of multiple sites without organ or systems involvement: Secondary | ICD-10-CM | POA: Diagnosis not present

## 2018-04-10 DIAGNOSIS — I10 Essential (primary) hypertension: Secondary | ICD-10-CM | POA: Diagnosis not present

## 2018-04-10 DIAGNOSIS — E782 Mixed hyperlipidemia: Secondary | ICD-10-CM | POA: Diagnosis not present

## 2018-05-10 DIAGNOSIS — H04123 Dry eye syndrome of bilateral lacrimal glands: Secondary | ICD-10-CM | POA: Diagnosis not present

## 2018-05-31 DIAGNOSIS — M154 Erosive (osteo)arthritis: Secondary | ICD-10-CM | POA: Diagnosis not present

## 2018-05-31 DIAGNOSIS — R768 Other specified abnormal immunological findings in serum: Secondary | ICD-10-CM | POA: Diagnosis not present

## 2018-05-31 DIAGNOSIS — M3501 Sicca syndrome with keratoconjunctivitis: Secondary | ICD-10-CM | POA: Diagnosis not present

## 2019-05-21 DIAGNOSIS — R69 Illness, unspecified: Secondary | ICD-10-CM | POA: Diagnosis not present

## 2019-08-14 DIAGNOSIS — K219 Gastro-esophageal reflux disease without esophagitis: Secondary | ICD-10-CM | POA: Diagnosis not present

## 2019-08-14 DIAGNOSIS — R739 Hyperglycemia, unspecified: Secondary | ICD-10-CM | POA: Diagnosis not present

## 2019-08-14 DIAGNOSIS — Z79899 Other long term (current) drug therapy: Secondary | ICD-10-CM | POA: Diagnosis not present

## 2019-08-14 DIAGNOSIS — I10 Essential (primary) hypertension: Secondary | ICD-10-CM | POA: Diagnosis not present

## 2019-08-14 DIAGNOSIS — E782 Mixed hyperlipidemia: Secondary | ICD-10-CM | POA: Diagnosis not present

## 2019-08-14 DIAGNOSIS — I25118 Atherosclerotic heart disease of native coronary artery with other forms of angina pectoris: Secondary | ICD-10-CM | POA: Diagnosis not present

## 2019-08-28 ENCOUNTER — Other Ambulatory Visit: Payer: Self-pay | Admitting: Internal Medicine

## 2019-08-28 DIAGNOSIS — Z1231 Encounter for screening mammogram for malignant neoplasm of breast: Secondary | ICD-10-CM

## 2019-09-09 DIAGNOSIS — Z1211 Encounter for screening for malignant neoplasm of colon: Secondary | ICD-10-CM | POA: Diagnosis not present

## 2019-09-24 DIAGNOSIS — R42 Dizziness and giddiness: Secondary | ICD-10-CM | POA: Diagnosis not present

## 2019-09-24 DIAGNOSIS — I10 Essential (primary) hypertension: Secondary | ICD-10-CM | POA: Diagnosis not present

## 2019-09-24 DIAGNOSIS — I252 Old myocardial infarction: Secondary | ICD-10-CM | POA: Diagnosis not present

## 2019-09-24 DIAGNOSIS — E782 Mixed hyperlipidemia: Secondary | ICD-10-CM | POA: Diagnosis not present

## 2019-09-24 DIAGNOSIS — I25118 Atherosclerotic heart disease of native coronary artery with other forms of angina pectoris: Secondary | ICD-10-CM | POA: Diagnosis not present

## 2019-09-24 DIAGNOSIS — R55 Syncope and collapse: Secondary | ICD-10-CM | POA: Diagnosis not present

## 2019-12-23 ENCOUNTER — Encounter (INDEPENDENT_AMBULATORY_CARE_PROVIDER_SITE_OTHER): Payer: Self-pay

## 2019-12-23 ENCOUNTER — Other Ambulatory Visit: Payer: Self-pay

## 2019-12-23 ENCOUNTER — Ambulatory Visit: Payer: Medicare HMO

## 2019-12-23 ENCOUNTER — Ambulatory Visit
Admission: RE | Admit: 2019-12-23 | Discharge: 2019-12-23 | Disposition: A | Discharge status: Home / Self Care | Payer: Medicare HMO | Source: Ambulatory Visit | Attending: Internal Medicine | Admitting: Internal Medicine

## 2019-12-23 DIAGNOSIS — Z1231 Encounter for screening mammogram for malignant neoplasm of breast: Secondary | ICD-10-CM | POA: Diagnosis not present

## 2020-02-12 DIAGNOSIS — E782 Mixed hyperlipidemia: Secondary | ICD-10-CM | POA: Diagnosis not present

## 2020-02-12 DIAGNOSIS — I25118 Atherosclerotic heart disease of native coronary artery with other forms of angina pectoris: Secondary | ICD-10-CM | POA: Diagnosis not present

## 2020-02-12 DIAGNOSIS — I10 Essential (primary) hypertension: Secondary | ICD-10-CM | POA: Diagnosis not present

## 2020-02-12 DIAGNOSIS — R55 Syncope and collapse: Secondary | ICD-10-CM | POA: Diagnosis not present

## 2020-02-16 DIAGNOSIS — R739 Hyperglycemia, unspecified: Secondary | ICD-10-CM | POA: Diagnosis not present

## 2020-02-16 DIAGNOSIS — E782 Mixed hyperlipidemia: Secondary | ICD-10-CM | POA: Diagnosis not present

## 2020-02-16 DIAGNOSIS — Z79899 Other long term (current) drug therapy: Secondary | ICD-10-CM | POA: Diagnosis not present

## 2020-02-16 DIAGNOSIS — I10 Essential (primary) hypertension: Secondary | ICD-10-CM | POA: Diagnosis not present

## 2020-02-16 DIAGNOSIS — I25118 Atherosclerotic heart disease of native coronary artery with other forms of angina pectoris: Secondary | ICD-10-CM | POA: Diagnosis not present

## 2020-02-16 DIAGNOSIS — Z0000 Encounter for general adult medical examination without abnormal findings: Secondary | ICD-10-CM | POA: Diagnosis not present

## 2020-08-04 DIAGNOSIS — I10 Essential (primary) hypertension: Secondary | ICD-10-CM | POA: Diagnosis not present

## 2020-08-04 DIAGNOSIS — I252 Old myocardial infarction: Secondary | ICD-10-CM | POA: Diagnosis not present

## 2020-08-04 DIAGNOSIS — I25118 Atherosclerotic heart disease of native coronary artery with other forms of angina pectoris: Secondary | ICD-10-CM | POA: Diagnosis not present

## 2020-08-04 DIAGNOSIS — E782 Mixed hyperlipidemia: Secondary | ICD-10-CM | POA: Diagnosis not present

## 2020-08-26 DIAGNOSIS — I10 Essential (primary) hypertension: Secondary | ICD-10-CM | POA: Diagnosis not present

## 2020-08-26 DIAGNOSIS — Z79899 Other long term (current) drug therapy: Secondary | ICD-10-CM | POA: Diagnosis not present

## 2020-08-26 DIAGNOSIS — I25118 Atherosclerotic heart disease of native coronary artery with other forms of angina pectoris: Secondary | ICD-10-CM | POA: Diagnosis not present

## 2020-08-26 DIAGNOSIS — Z0000 Encounter for general adult medical examination without abnormal findings: Secondary | ICD-10-CM | POA: Diagnosis not present

## 2020-08-26 DIAGNOSIS — E782 Mixed hyperlipidemia: Secondary | ICD-10-CM | POA: Diagnosis not present

## 2020-08-26 DIAGNOSIS — M19041 Primary osteoarthritis, right hand: Secondary | ICD-10-CM | POA: Diagnosis not present

## 2020-08-26 DIAGNOSIS — R739 Hyperglycemia, unspecified: Secondary | ICD-10-CM | POA: Diagnosis not present

## 2020-08-26 DIAGNOSIS — M19042 Primary osteoarthritis, left hand: Secondary | ICD-10-CM | POA: Diagnosis not present

## 2020-08-26 DIAGNOSIS — R1310 Dysphagia, unspecified: Secondary | ICD-10-CM | POA: Diagnosis not present

## 2020-08-27 ENCOUNTER — Other Ambulatory Visit: Payer: Self-pay | Admitting: Internal Medicine

## 2020-08-27 DIAGNOSIS — R1310 Dysphagia, unspecified: Secondary | ICD-10-CM

## 2020-09-01 DIAGNOSIS — Z1211 Encounter for screening for malignant neoplasm of colon: Secondary | ICD-10-CM | POA: Diagnosis not present

## 2020-11-10 DIAGNOSIS — R1310 Dysphagia, unspecified: Secondary | ICD-10-CM | POA: Diagnosis not present

## 2020-11-10 DIAGNOSIS — I10 Essential (primary) hypertension: Secondary | ICD-10-CM | POA: Diagnosis not present

## 2020-11-12 ENCOUNTER — Ambulatory Visit
Admission: RE | Admit: 2020-11-12 | Discharge: 2020-11-12 | Disposition: A | Discharge status: Home / Self Care | Payer: Medicare HMO | Source: Ambulatory Visit | Attending: Internal Medicine | Admitting: Internal Medicine

## 2020-11-12 ENCOUNTER — Other Ambulatory Visit: Payer: Self-pay | Admitting: Internal Medicine

## 2020-11-12 ENCOUNTER — Other Ambulatory Visit: Payer: Self-pay

## 2020-11-12 DIAGNOSIS — R1310 Dysphagia, unspecified: Secondary | ICD-10-CM

## 2020-11-12 DIAGNOSIS — K219 Gastro-esophageal reflux disease without esophagitis: Secondary | ICD-10-CM | POA: Diagnosis not present

## 2020-11-15 ENCOUNTER — Ambulatory Visit: Payer: Medicare HMO

## 2020-11-17 ENCOUNTER — Other Ambulatory Visit: Payer: Self-pay

## 2020-11-17 ENCOUNTER — Other Ambulatory Visit
Admission: RE | Admit: 2020-11-17 | Discharge: 2020-11-17 | Disposition: A | Discharge status: Home / Self Care | Payer: Medicare HMO | Source: Ambulatory Visit | Attending: Gastroenterology | Admitting: Gastroenterology

## 2020-11-17 DIAGNOSIS — Z01812 Encounter for preprocedural laboratory examination: Secondary | ICD-10-CM | POA: Diagnosis not present

## 2020-11-17 DIAGNOSIS — K222 Esophageal obstruction: Secondary | ICD-10-CM | POA: Diagnosis not present

## 2020-11-17 DIAGNOSIS — K449 Diaphragmatic hernia without obstruction or gangrene: Secondary | ICD-10-CM | POA: Diagnosis not present

## 2020-11-17 DIAGNOSIS — R933 Abnormal findings on diagnostic imaging of other parts of digestive tract: Secondary | ICD-10-CM | POA: Diagnosis not present

## 2020-11-17 DIAGNOSIS — R634 Abnormal weight loss: Secondary | ICD-10-CM | POA: Diagnosis not present

## 2020-11-17 DIAGNOSIS — Z20822 Contact with and (suspected) exposure to covid-19: Secondary | ICD-10-CM | POA: Diagnosis not present

## 2020-11-17 DIAGNOSIS — R1319 Other dysphagia: Secondary | ICD-10-CM | POA: Diagnosis not present

## 2020-11-18 ENCOUNTER — Encounter: Payer: Self-pay | Admitting: *Deleted

## 2020-11-18 LAB — SARS CORONAVIRUS 2 (TAT 6-24 HRS): SARS Coronavirus 2: NEGATIVE

## 2020-11-19 ENCOUNTER — Ambulatory Visit: Payer: Medicare HMO | Admitting: Anesthesiology

## 2020-11-19 ENCOUNTER — Encounter: Payer: Self-pay | Admitting: Gastroenterology

## 2020-11-19 ENCOUNTER — Encounter
Admission: RE | Disposition: A | Discharge status: Home / Self Care | Payer: Self-pay | Source: Home / Self Care | Attending: Gastroenterology

## 2020-11-19 ENCOUNTER — Ambulatory Visit
Admission: RE | Admit: 2020-11-19 | Discharge: 2020-11-19 | Disposition: A | Discharge status: Home / Self Care | Payer: Medicare HMO | Attending: Gastroenterology | Admitting: Gastroenterology

## 2020-11-19 DIAGNOSIS — Z955 Presence of coronary angioplasty implant and graft: Secondary | ICD-10-CM | POA: Diagnosis not present

## 2020-11-19 DIAGNOSIS — R933 Abnormal findings on diagnostic imaging of other parts of digestive tract: Secondary | ICD-10-CM | POA: Diagnosis not present

## 2020-11-19 DIAGNOSIS — K2950 Unspecified chronic gastritis without bleeding: Secondary | ICD-10-CM | POA: Insufficient documentation

## 2020-11-19 DIAGNOSIS — Z79899 Other long term (current) drug therapy: Secondary | ICD-10-CM | POA: Insufficient documentation

## 2020-11-19 DIAGNOSIS — R1310 Dysphagia, unspecified: Secondary | ICD-10-CM | POA: Insufficient documentation

## 2020-11-19 DIAGNOSIS — K449 Diaphragmatic hernia without obstruction or gangrene: Secondary | ICD-10-CM | POA: Diagnosis not present

## 2020-11-19 DIAGNOSIS — Z7982 Long term (current) use of aspirin: Secondary | ICD-10-CM | POA: Insufficient documentation

## 2020-11-19 DIAGNOSIS — K219 Gastro-esophageal reflux disease without esophagitis: Secondary | ICD-10-CM | POA: Diagnosis not present

## 2020-11-19 DIAGNOSIS — Z888 Allergy status to other drugs, medicaments and biological substances status: Secondary | ICD-10-CM | POA: Diagnosis not present

## 2020-11-19 DIAGNOSIS — K2930 Chronic superficial gastritis without bleeding: Secondary | ICD-10-CM | POA: Diagnosis not present

## 2020-11-19 DIAGNOSIS — K2970 Gastritis, unspecified, without bleeding: Secondary | ICD-10-CM | POA: Diagnosis not present

## 2020-11-19 DIAGNOSIS — I10 Essential (primary) hypertension: Secondary | ICD-10-CM | POA: Diagnosis not present

## 2020-11-19 DIAGNOSIS — K222 Esophageal obstruction: Secondary | ICD-10-CM | POA: Insufficient documentation

## 2020-11-19 DIAGNOSIS — I2510 Atherosclerotic heart disease of native coronary artery without angina pectoris: Secondary | ICD-10-CM | POA: Diagnosis not present

## 2020-11-19 DIAGNOSIS — D649 Anemia, unspecified: Secondary | ICD-10-CM | POA: Diagnosis not present

## 2020-11-19 HISTORY — DX: Unspecified fracture of unspecified metacarpal bone, initial encounter for closed fracture: S62.309A

## 2020-11-19 HISTORY — PX: ESOPHAGOGASTRODUODENOSCOPY: SHX5428

## 2020-11-19 HISTORY — DX: Unspecified osteoarthritis, unspecified site: M19.90

## 2020-11-19 HISTORY — DX: Sick sinus syndrome: I49.5

## 2020-11-19 HISTORY — DX: Cortical age-related cataract, unspecified eye: H25.019

## 2020-11-19 HISTORY — DX: Gastro-esophageal reflux disease without esophagitis: K21.9

## 2020-11-19 HISTORY — DX: Hyperlipidemia, unspecified: E78.5

## 2020-11-19 HISTORY — DX: Age-related osteoporosis without current pathological fracture: M81.0

## 2020-11-19 HISTORY — DX: Atherosclerotic heart disease of native coronary artery without angina pectoris: I25.10

## 2020-11-19 HISTORY — DX: Diverticulosis of intestine, part unspecified, without perforation or abscess without bleeding: K57.90

## 2020-11-19 SURGERY — EGD (ESOPHAGOGASTRODUODENOSCOPY)
Anesthesia: General

## 2020-11-19 MED ORDER — SODIUM CHLORIDE 0.9 % IV SOLN
INTRAVENOUS | Status: DC
  Administered 2020-11-19: 20 mL/h via INTRAVENOUS

## 2020-11-19 MED ORDER — PROPOFOL 500 MG/50ML IV EMUL
INTRAVENOUS | Status: DC | PRN
  Administered 2020-11-19: 50 ug/kg/min via INTRAVENOUS

## 2020-11-19 MED ORDER — LIDOCAINE HCL (PF) 2 % IJ SOLN
INTRAMUSCULAR | Status: DC | PRN
  Administered 2020-11-19: 50 mg

## 2020-11-19 MED ORDER — PROPOFOL 10 MG/ML IV BOLUS
INTRAVENOUS | Status: DC | PRN
  Administered 2020-11-19 (×2): 10 mg via INTRAVENOUS
  Administered 2020-11-19: 20 mg via INTRAVENOUS
  Administered 2020-11-19: 10 mg via INTRAVENOUS

## 2020-11-19 MED ORDER — GLYCOPYRROLATE 0.2 MG/ML IJ SOLN
INTRAMUSCULAR | Status: DC | PRN
  Administered 2020-11-19: .4 mg via INTRAVENOUS

## 2020-11-19 NOTE — H&P (Signed)
Outpatient short stay form Pre-procedure 11/19/2020 11:43 AM Raylene Miyamoto MD, MPH.  Primary Physician: Dr. Doy Hutching  Reason for visit:  Dysphagia  History of present illness:   86 y/o lady with history of hypertension with solid food dysphagia for six months which has been progressive to having issues with liquids. No blood thinners. No family history of GI malignancies. Has been taking PPI.    Current Facility-Administered Medications:  .  0.9 %  sodium chloride infusion, , Intravenous, Continuous, Kiron Osmun, Hilton Cork, MD, Last Rate: 20 mL/hr at 11/19/20 1124, 20 mL/hr at 11/19/20 1124  Medications Prior to Admission  Medication Sig Dispense Refill Last Dose  . Artificial Tear Solution (TEARS NATURALE OP) Apply to eye daily as needed (dry eyes).   Past Week at Unknown time  . aspirin EC 81 MG tablet Take 81 mg by mouth daily.   Past Week at Unknown time  . atorvastatin (LIPITOR) 10 MG tablet Take 10 mg by mouth daily.   Past Week at Unknown time  . lisinopril (PRINIVIL,ZESTRIL) 40 MG tablet Take 40 mg by mouth 2 (two) times daily.   11/19/2020 at 0700  . losartan-hydrochlorothiazide (HYZAAR) 100-12.5 MG tablet Take 1 tablet by mouth daily.   11/19/2020 at 0700  . metoprolol tartrate (LOPRESSOR) 25 MG tablet Take 25 mg by mouth 2 (two) times daily.   11/19/2020 at 0700  . Multiple Vitamin (MULTIVITAMIN ADULT PO) Take by mouth daily.   Past Week at Unknown time  . Omega-3 Fatty Acids (EQL OMEGA 3 FISH OIL PO) Take by mouth daily.   Past Week at Unknown time  . pantoprazole (PROTONIX) 20 MG tablet Take 20 mg by mouth daily.   Past Week at Unknown time  . vitamin E 180 MG (400 UNITS) capsule Take 400 Units by mouth daily.   Past Week at Unknown time     Allergies  Allergen Reactions  . Zocor [Simvastatin]      Past Medical History:  Diagnosis Date  . Anemia   . Arthritis   . Cancer ([place])    skin ca  . Cataract cortical, senile   . Coronary artery disease   . Coronary  atherosclerosis of native coronary artery   . Diverticulosis   . GERD (gastroesophageal reflux disease)   . HOH (hard of hearing)    AIDS  . Hyperlipidemia   . Hypertension   . Metacarpal bone fracture   . Myocardial infarction ([place])    2009  . Osteoarthritis   . Osteoporosis   . Sinoatrial node dysfunction (HCC)     Review of systems:  Otherwise negative.    Physical Exam  Gen: Alert, oriented. Appears stated age.  HEENT: PERRLA. Lungs: No respiratory distress CV: RRR Abd: soft, benign, no masses Ext: No edema    Planned procedures: Proceed with EGD. The patient understands the nature of the planned procedure, indications, risks, alternatives and potential complications including but not limited to bleeding, infection, perforation, damage to internal organs and possible oversedation/side effects from anesthesia. The patient agrees and gives consent to proceed.  Please refer to procedure notes for findings, recommendations and patient disposition/instructions.     Raylene Miyamoto MD, MPH Gastroenterology 11/19/2020  11:43 AM

## 2020-11-19 NOTE — Op Note (Signed)
[facility] Gastroenterology Patient Name: Lisa Gonzales Procedure Date: 11/19/2020 11:42 AM MRN: [id number] Account #: [id number] Date of Birth: 07/22/1935 Admit Type: Outpatient Age: 86 Room: [facility] ENDO ROOM 3 Gender: Female Note Status: Finalized Procedure:             Upper GI endoscopy Indications:           Dysphagia, Abnormal UGI series Providers:             Andrey Farmer MD, MD Referring MD:          Leonie Douglas. Doy Hutching, MD (Referring MD) Medicines:             Monitored Anesthesia Care Complications:         No immediate complications. Estimated blood loss:                         Minimal. Procedure:             Pre-Anesthesia Assessment:                        - Prior to the procedure, a History and Physical was                         performed, and patient medications and allergies were                         reviewed. The patient is competent. The risks and                         benefits of the procedure and the sedation options and                         risks were discussed with the patient. All questions                         were answered and informed consent was obtained.                         Patient identification and proposed procedure were                         verified by the physician, the nurse, the anesthetist                         and the technician in the endoscopy suite. Mental                         Status Examination: alert and oriented. Airway                         Examination: normal oropharyngeal airway and neck                         mobility. Respiratory Examination: clear to                         auscultation. CV Examination: normal. Prophylactic  Antibiotics: The patient does not require prophylactic                         antibiotics. Prior Anticoagulants: The patient has                         taken no previous anticoagulant or antiplatelet                         agents. ASA Grade  Assessment: II - A patient with mild                         systemic disease. After reviewing the risks and                         benefits, the patient was deemed in satisfactory                         condition to undergo the procedure. The anesthesia                         plan was to use monitored anesthesia care (MAC).                         Immediately prior to administration of medications,                         the patient was re-assessed for adequacy to receive                         sedatives. The heart rate, respiratory rate, oxygen                         saturations, blood pressure, adequacy of pulmonary                         ventilation, and response to care were monitored                         throughout the procedure. The physical status of the                         patient was re-assessed after the procedure.                        After obtaining informed consent, the endoscope was                         passed under direct vision. Throughout the procedure,                         the patient's blood pressure, pulse, and oxygen                         saturations were monitored continuously. The Endoscope                         was introduced through the mouth, and advanced to the  second part of duodenum. The upper GI endoscopy was                         accomplished without difficulty. The patient tolerated                         the procedure well. Findings:      One benign-appearing, intrinsic moderate (circumferential scarring or       stenosis; an endoscope may pass) stenosis was found. This stenosis       measured 1.1 cm (inner diameter) x less than one cm (in length). The       stenosis was traversed. A TTS dilator was passed through the scope.       Dilation with a 12-13.5-15 mm balloon dilator was performed to 15 mm.       The dilation site was examined and showed mild mucosal disruption. A TTS       dilator was passed  through the scope. Dilation with a 15-16.5-18 mm       balloon dilator was performed to 16.5 mm. The dilation site was examined       and showed moderate mucosal disruption. Estimated blood loss was minimal.      A 6 cm hiatal hernia was present.      Diffuse moderate inflammation characterized by erythema was found in the       entire examined stomach. Biopsies were taken with a cold forceps for       Helicobacter pylori testing. Estimated blood loss was minimal.      The examined duodenum was normal. Impression:            - Benign-appearing esophageal stenosis. Dilated.                        - 6 cm hiatal hernia.                        - Gastritis. Biopsied.                        - Normal examined duodenum. Recommendation:        - Discharge patient to home.                        - Resume previous diet.                        - Continue present medications.                        - Await pathology results.                        - Repeat upper endoscopy in 1 month for retreatment.                        - Return to referring physician as previously                         scheduled. Procedure Code(s):     --- Professional ---                        [phone], Esophagogastroduodenoscopy,  flexible,                         transoral; with transendoscopic balloon dilation of                         esophagus (less than 30 mm diameter) Diagnosis Code(s):     --- Professional ---                        K22.2, Esophageal obstruction                        K44.9, Diaphragmatic hernia without obstruction or                         gangrene                        K29.70, Gastritis, unspecified, without bleeding                        R13.10, Dysphagia, unspecified                        R93.3, Abnormal findings on diagnostic imaging of                         other parts of digestive tract CPT copyright 2019 American Medical Association. All rights reserved. The codes documented in this  report are preliminary and upon coder review may  be revised to meet current compliance requirements. Andrey Farmer MD, MD 11/19/2020 12:09:53 PM Number of Addenda: 0 Note Initiated On: 11/19/2020 11:42 AM Estimated Blood Loss:  Estimated blood loss was minimal.      [facility] [address]

## 2020-11-19 NOTE — Anesthesia Preprocedure Evaluation (Signed)
Anesthesia Evaluation  Patient identified by MRN, date of birth, ID band Patient awake    Reviewed: Allergy & Precautions, NPO status , Patient's Chart, lab work & pertinent test results, reviewed documented beta blocker date and time   History of Anesthesia Complications Negative for: history of anesthetic complications  Airway Mallampati: III  TM Distance: <3 FB     Dental  (+) Lower Dentures, Dental Advidsory Given, Poor Dentition   Pulmonary neg pulmonary ROS,    Pulmonary exam normal        Cardiovascular hypertension, Pt. on medications and Pt. on home beta blockers (-) angina+ CAD, + Past MI and + Cardiac Stents  Normal cardiovascular exam(-) dysrhythmias (-) Valvular Problems/Murmurs     Neuro/Psych negative neurological ROS  negative psych ROS   GI/Hepatic Neg liver ROS, GERD  ,  Endo/Other  negative endocrine ROS  Renal/GU negative Renal ROS     Musculoskeletal  (+) Arthritis , Osteoarthritis,    Abdominal Normal abdominal exam  (+)   Peds negative pediatric ROS (+)  Hematology  (+) Blood dyscrasia, anemia ,   Anesthesia Other Findings Past Medical History: No date: Anemia No date: Arthritis No date: Cancer ([place])     Comment: skin ca No date: Coronary artery disease No date: HOH (hard of hearing)     Comment: AIDS No date: Hypertension No date: Myocardial infarction [facility])     Comment: 2009  Reproductive/Obstetrics                             Anesthesia Physical  Anesthesia Plan  ASA: III  Anesthesia Plan: General   Post-op Pain Management:    Induction: Intravenous  PONV Risk Score and Plan: 3 and TIVA and Propofol infusion  Airway Management Planned: Nasal Cannula and Natural Airway  Additional Equipment:   Intra-op Plan:   Post-operative Plan:   Informed Consent: I have reviewed the patients History and Physical, chart, labs and discussed the procedure  including the risks, benefits and alternatives for the proposed anesthesia with the patient or authorized representative who has indicated his/her understanding and acceptance.     Dental advisory given  Plan Discussed with: CRNA and Surgeon  Anesthesia Plan Comments:         Anesthesia Quick Evaluation

## 2020-11-19 NOTE — Anesthesia Postprocedure Evaluation (Signed)
Anesthesia Post Note  Patient: Lisa Gonzales  Procedure(s) Performed: ESOPHAGOGASTRODUODENOSCOPY (EGD) (N/A )  Patient location during evaluation: Endoscopy Anesthesia Type: General Level of consciousness: awake and alert Pain management: pain level controlled Vital Signs Assessment: post-procedure vital signs reviewed and stable Respiratory status: spontaneous breathing, nonlabored ventilation, respiratory function stable and patient connected to nasal cannula oxygen Cardiovascular status: blood pressure returned to baseline and stable Postop Assessment: no apparent nausea or vomiting Anesthetic complications: no   No complications documented.   Last Vitals:  Vitals:   11/19/20 1212 11/19/20 1222  BP:  (!) 166/62  Pulse:    Resp:    Temp: (!) 36.2 C   SpO2:      Last Pain:  Vitals:   11/19/20 1232  TempSrc:   PainSc: 0-No pain                 Martha Clan

## 2020-11-19 NOTE — Interval H&P Note (Signed)
History and Physical Interval Note:  11/19/2020 11:45 AM  Lisa Gonzales Heasley  has presented today for surgery, with the diagnosis of ESOPHAGEAL STRICTURE.  The various methods of treatment have been discussed with the patient and family. After consideration of risks, benefits and other options for treatment, the patient has consented to  Procedure(s): ESOPHAGOGASTRODUODENOSCOPY (EGD) (N/A) as a surgical intervention.  The patient's history has been reviewed, patient examined, no change in status, stable for surgery.  I have reviewed the patient's chart and labs.  Questions were answered to the patient's satisfaction.     [name]  Ok to proceed with EGD

## 2020-11-19 NOTE — Transfer of Care (Signed)
Immediate Anesthesia Transfer of Care Note  Patient: Lisa Gonzales  Procedure(s) Performed: ESOPHAGOGASTRODUODENOSCOPY (EGD) (N/A )  Patient Location: PACU  Anesthesia Type:General  Level of Consciousness: sedated  Airway & Oxygen Therapy: Patient Spontanous Breathing and Patient connected to nasal cannula oxygen  Post-op Assessment: Report given to RN and Post -op Vital signs reviewed and stable  Post vital signs: Reviewed and stable  Last Vitals:  Vitals Value Taken Time  BP 129/57 11/19/20 1212  Temp 36.2 C 11/19/20 1212  Pulse 55 11/19/20 1212  Resp 19 11/19/20 1212  SpO2 98 % 11/19/20 1212  Vitals shown include unvalidated device data.  Last Pain:  Vitals:   11/19/20 1212  TempSrc:   PainSc: 0-No pain         Complications: No complications documented.

## 2020-11-23 LAB — SURGICAL PATHOLOGY

## 2020-12-24 ENCOUNTER — Other Ambulatory Visit: Payer: Self-pay | Admitting: Internal Medicine

## 2020-12-24 DIAGNOSIS — Z1231 Encounter for screening mammogram for malignant neoplasm of breast: Secondary | ICD-10-CM

## 2021-01-20 ENCOUNTER — Other Ambulatory Visit: Admission: RE | Admit: 2021-01-20 | Payer: Medicare HMO | Source: Ambulatory Visit

## 2021-01-21 ENCOUNTER — Encounter: Payer: Self-pay | Admitting: *Deleted

## 2021-01-24 ENCOUNTER — Ambulatory Visit
Admission: RE | Admit: 2021-01-24 | Discharge: 2021-01-24 | Disposition: A | Discharge status: Home / Self Care | Payer: Medicare HMO | Attending: Gastroenterology | Admitting: Gastroenterology

## 2021-01-24 ENCOUNTER — Encounter
Admission: RE | Disposition: A | Discharge status: Home / Self Care | Payer: Self-pay | Source: Home / Self Care | Attending: Gastroenterology

## 2021-01-24 ENCOUNTER — Ambulatory Visit: Payer: Medicare HMO | Admitting: Anesthesiology

## 2021-01-24 DIAGNOSIS — K222 Esophageal obstruction: Secondary | ICD-10-CM | POA: Diagnosis not present

## 2021-01-24 DIAGNOSIS — Z85828 Personal history of other malignant neoplasm of skin: Secondary | ICD-10-CM | POA: Insufficient documentation

## 2021-01-24 DIAGNOSIS — K449 Diaphragmatic hernia without obstruction or gangrene: Secondary | ICD-10-CM | POA: Diagnosis not present

## 2021-01-24 DIAGNOSIS — Z79899 Other long term (current) drug therapy: Secondary | ICD-10-CM | POA: Insufficient documentation

## 2021-01-24 DIAGNOSIS — K2970 Gastritis, unspecified, without bleeding: Secondary | ICD-10-CM | POA: Diagnosis not present

## 2021-01-24 DIAGNOSIS — Z7982 Long term (current) use of aspirin: Secondary | ICD-10-CM | POA: Insufficient documentation

## 2021-01-24 DIAGNOSIS — K219 Gastro-esophageal reflux disease without esophagitis: Secondary | ICD-10-CM | POA: Diagnosis not present

## 2021-01-24 DIAGNOSIS — K2950 Unspecified chronic gastritis without bleeding: Secondary | ICD-10-CM | POA: Diagnosis not present

## 2021-01-24 DIAGNOSIS — K3189 Other diseases of stomach and duodenum: Secondary | ICD-10-CM | POA: Diagnosis not present

## 2021-01-24 DIAGNOSIS — Z955 Presence of coronary angioplasty implant and graft: Secondary | ICD-10-CM | POA: Diagnosis not present

## 2021-01-24 HISTORY — PX: ESOPHAGOGASTRODUODENOSCOPY (EGD) WITH PROPOFOL: SHX5813

## 2021-01-24 SURGERY — ESOPHAGOGASTRODUODENOSCOPY (EGD) WITH PROPOFOL
Anesthesia: General

## 2021-01-24 MED ORDER — PROPOFOL 500 MG/50ML IV EMUL
INTRAVENOUS | Status: AC
  Filled 2021-01-24: qty 50

## 2021-01-24 MED ORDER — LIDOCAINE HCL (CARDIAC) PF 100 MG/5ML IV SOSY
PREFILLED_SYRINGE | INTRAVENOUS | Status: DC | PRN
  Administered 2021-01-24: 50 mg via INTRAVENOUS

## 2021-01-24 MED ORDER — PROPOFOL 10 MG/ML IV BOLUS
INTRAVENOUS | Status: DC | PRN
  Administered 2021-01-24 (×3): 20 mg via INTRAVENOUS
  Administered 2021-01-24: 60 mg via INTRAVENOUS

## 2021-01-24 MED ORDER — LIDOCAINE HCL (PF) 1 % IJ SOLN
INTRAMUSCULAR | Status: AC
  Filled 2021-01-24: qty 2

## 2021-01-24 MED ORDER — SODIUM CHLORIDE 0.9 % IV SOLN
INTRAVENOUS | Status: DC
  Administered 2021-01-24: 1000 mL via INTRAVENOUS

## 2021-01-24 NOTE — Op Note (Signed)
[facility] Gastroenterology Patient Name: Lisa Gonzales Procedure Date: 01/24/2021 9:26 AM MRN: [id number] Account #: [id number] Date of Birth: [birth year] Admit Type: Outpatient Age: 86 Room: [facility] ENDO ROOM 3 Gender: Female Note Status: Finalized Procedure:             Upper GI endoscopy Indications:           Stenosis of the esophagus Providers:             Andrey Farmer MD, MD Referring MD:          Leonie Douglas. Doy Hutching, MD (Referring MD) Medicines:             Monitored Anesthesia Care Complications:         No immediate complications. Estimated blood loss:                         Minimal. Procedure:             Pre-Anesthesia Assessment:                        - Prior to the procedure, a History and Physical was                         performed, and patient medications and allergies were                         reviewed. The patient is competent. The risks and                         benefits of the procedure and the sedation options and                         risks were discussed with the patient. All questions                         were answered and informed consent was obtained.                         Patient identification and proposed procedure were                         verified by the physician, the nurse, the anesthetist                         and the technician in the endoscopy suite. Mental                         Status Examination: alert and oriented. Airway                         Examination: normal oropharyngeal airway and neck                         mobility. Respiratory Examination: clear to                         auscultation. CV Examination: normal. Prophylactic  Antibiotics: The patient does not require prophylactic                         antibiotics. Prior Anticoagulants: The patient has                         taken no previous anticoagulant or antiplatelet                         agents. ASA Grade  Assessment: II - A patient with mild                         systemic disease. After reviewing the risks and                         benefits, the patient was deemed in satisfactory                         condition to undergo the procedure. The anesthesia                         plan was to use monitored anesthesia care (MAC).                         Immediately prior to administration of medications,                         the patient was re-assessed for adequacy to receive                         sedatives. The heart rate, respiratory rate, oxygen                         saturations, blood pressure, adequacy of pulmonary                         ventilation, and response to care were monitored                         throughout the procedure. The physical status of the                         patient was re-assessed after the procedure.                        After obtaining informed consent, the endoscope was                         passed under direct vision. Throughout the procedure,                         the patient's blood pressure, pulse, and oxygen                         saturations were monitored continuously. The Endoscope                         was introduced through the mouth, and advanced to the  second part of duodenum. The upper GI endoscopy was                         accomplished without difficulty. The patient tolerated                         the procedure well. Findings:      A non-obstructing Schatzki ring was found in the lower third of the       esophagus. A TTS dilator was passed through the scope. Dilation with a       15-16.5-18 mm balloon dilator was performed to 18 mm. The dilation site       was examined and showed mild mucosal disruption. Estimated blood loss       was minimal.      A large hiatal hernia was present.      Scattered moderate inflammation characterized by erythema was found in       the entire examined stomach.  Biopsies were taken with a cold forceps for       Helicobacter pylori testing. Estimated blood loss was minimal.      The examined duodenum was normal. Impression:            - Non-obstructing Schatzki ring. Dilated.                        - Large hiatal hernia.                        - Gastritis. Biopsied.                        - Normal examined duodenum. Recommendation:        - Discharge patient to home.                        - Resume previous diet.                        - Continue present medications.                        - Await pathology results.                        - Return to referring physician as previously                         scheduled. Procedure Code(s):     --- Professional ---                        [phone], Esophagogastroduodenoscopy, flexible,                         transoral; with transendoscopic balloon dilation of                         esophagus (less than 30 mm diameter) Diagnosis Code(s):     --- Professional ---                        K22.2, Esophageal obstruction  K44.9, Diaphragmatic hernia without obstruction or                         gangrene                        K29.70, Gastritis, unspecified, without bleeding CPT copyright 2019 American Medical Association. All rights reserved. The codes documented in this report are preliminary and upon coder review may  be revised to meet current compliance requirements. Andrey Farmer MD, MD 01/24/2021 9:44:09 AM Number of Addenda: 0 Note Initiated On: 01/24/2021 9:26 AM Estimated Blood Loss:  Estimated blood loss was minimal.      [facility]

## 2021-01-24 NOTE — Anesthesia Postprocedure Evaluation (Signed)
Anesthesia Post Note  Patient: Imanie Huffines Salvi  Procedure(s) Performed: ESOPHAGOGASTRODUODENOSCOPY (EGD) WITH PROPOFOL (N/A )  Patient location during evaluation: Endoscopy Anesthesia Type: General Level of consciousness: awake and alert Pain management: pain level controlled Vital Signs Assessment: post-procedure vital signs reviewed and stable Respiratory status: spontaneous breathing, nonlabored ventilation, respiratory function stable and patient connected to nasal cannula oxygen Cardiovascular status: blood pressure returned to baseline and stable Postop Assessment: no apparent nausea or vomiting Anesthetic complications: no   No complications documented.   Last Vitals:  Vitals:   01/24/21 0954 01/24/21 1004  BP: (!) 121/50 (!) 154/52  Pulse: (!) 51 (!) 49  Resp: 18 18  Temp:    SpO2: 95% 97%    Last Pain:  Vitals:   01/24/21 1004  PainSc: 0-No pain                 Arita Miss

## 2021-01-24 NOTE — Transfer of Care (Signed)
Immediate Anesthesia Transfer of Care Note  Patient: Sibbie Huffines Nole  Procedure(s) Performed: ESOPHAGOGASTRODUODENOSCOPY (EGD) WITH PROPOFOL (N/A )  Patient Location: PACU  Anesthesia Type:MAC  Level of Consciousness: awake and alert   Airway & Oxygen Therapy: Patient Spontanous Breathing  Post-op Assessment: Report given to RN and Post -op Vital signs reviewed and stable  Post vital signs: stable  Last Vitals:  Vitals Value Taken Time  BP 95/38 01/24/21 0944  Temp    Pulse 50 01/24/21 0950  Resp 18 01/24/21 0950  SpO2 95 % 01/24/21 0950  Vitals shown include unvalidated device data.  Last Pain:  Vitals:   01/24/21 0944  PainSc: 0-No pain         Complications: No complications documented.

## 2021-01-24 NOTE — Anesthesia Preprocedure Evaluation (Signed)
Anesthesia Evaluation  Patient identified by MRN, date of birth, ID band Patient awake    Reviewed: Allergy & Precautions, NPO status , Patient's Chart, lab work & pertinent test results, reviewed documented beta blocker date and time   History of Anesthesia Complications Negative for: history of anesthetic complications  Airway Mallampati: III  TM Distance: <3 FB     Dental  (+) Lower Dentures, Dental Advidsory Given, Poor Dentition   Pulmonary neg pulmonary ROS,    Pulmonary exam normal        Cardiovascular Exercise Tolerance: Good hypertension, Pt. on medications and Pt. on home beta blockers (-) angina+ CAD, + Past MI and + Cardiac Stents  Normal cardiovascular exam(-) dysrhythmias (-) Valvular Problems/Murmurs     Neuro/Psych negative neurological ROS  negative psych ROS   GI/Hepatic Neg liver ROS, GERD  ,  Endo/Other  negative endocrine ROS  Renal/GU negative Renal ROS     Musculoskeletal  (+) Arthritis , Osteoarthritis,    Abdominal Normal abdominal exam  (+)   Peds negative pediatric ROS (+)  Hematology  (+) Blood dyscrasia, anemia ,   Anesthesia Other Findings Past Medical History: No date: Anemia No date: Arthritis No date: Cancer ([place])     Comment: skin ca No date: Coronary artery disease No date: HOH (hard of hearing)     Comment: AIDS No date: Hypertension No date: Myocardial infarction [facility])     Comment: 2009  Reproductive/Obstetrics                            Anesthesia Physical  Anesthesia Plan  ASA: III  Anesthesia Plan: General   Post-op Pain Management:    Induction: Intravenous  PONV Risk Score and Plan: 3 and TIVA and Propofol infusion  Airway Management Planned: Nasal Cannula and Natural Airway  Additional Equipment: None  Intra-op Plan:   Post-operative Plan:   Informed Consent: I have reviewed the patients History and Physical, chart, labs  and discussed the procedure including the risks, benefits and alternatives for the proposed anesthesia with the patient or authorized representative who has indicated his/her understanding and acceptance.     Dental advisory given  Plan Discussed with: CRNA and Surgeon  Anesthesia Plan Comments: (Discussed risks of anesthesia with patient, including possibility of difficulty with spontaneous ventilation under anesthesia necessitating airway intervention, PONV, and rare risks such as cardiac or respiratory or neurological events. Patient understands.)        Anesthesia Quick Evaluation

## 2021-01-24 NOTE — H&P (Signed)
Outpatient short stay form Pre-procedure 01/24/2021 9:21 AM Raylene Miyamoto MD, MPH  Primary Physician: Dr. Doy Hutching  Reason for visit:  Hx of esophageal stricture  History of present illness:   86 y/o lady with known esophageal stricture here for repeat dilation. No family history of GI malignancies. No blood thinners. Dysphagia symptoms greatly improved from last visit.    Current Facility-Administered Medications:  .  0.9 %  sodium chloride infusion, , Intravenous, Continuous, Laqueena Hinchey, Hilton Cork, MD, Last Rate: 20 mL/hr at 01/24/21 0915, 1,000 mL at 01/24/21 0915 .  lidocaine (PF) (XYLOCAINE) 1 % injection, , , ,   Medications Prior to Admission  Medication Sig Dispense Refill Last Dose  . acetaminophen (TYLENOL) 325 MG tablet Take 650 mg by mouth every 6 (six) hours as needed for moderate pain or mild pain.     Marland Kitchen amLODipine (NORVASC) 5 MG tablet Take 5 mg by mouth daily.     Marland Kitchen aspirin EC 81 MG tablet Take 81 mg by mouth daily.   01/24/2021  . losartan-hydrochlorothiazide (HYZAAR) 100-12.5 MG tablet Take 1 tablet by mouth daily.   01/24/2021 at 0700  . Artificial Tear Solution (TEARS NATURALE OP) Apply to eye daily as needed (dry eyes).     Marland Kitchen atorvastatin (LIPITOR) 10 MG tablet Take 10 mg by mouth daily.     Marland Kitchen lisinopril (PRINIVIL,ZESTRIL) 40 MG tablet Take 40 mg by mouth 2 (two) times daily.     . metoprolol tartrate (LOPRESSOR) 25 MG tablet Take 50 mg by mouth 2 (two) times daily.     . Multiple Vitamin (MULTIVITAMIN ADULT PO) Take by mouth daily.     . Omega-3 Fatty Acids (EQL OMEGA 3 FISH OIL PO) Take by mouth daily.     . pantoprazole (PROTONIX) 20 MG tablet Take 20 mg by mouth daily.     . vitamin E 180 MG (400 UNITS) capsule Take 400 Units by mouth daily.        Allergies  Allergen Reactions  . Zocor [Simvastatin]      Past Medical History:  Diagnosis Date  . Anemia   . Arthritis   . Cancer ([place])    skin ca  . Cataract cortical, senile   . Coronary artery disease    . Coronary atherosclerosis of native coronary artery   . Diverticulosis   . GERD (gastroesophageal reflux disease)   . HOH (hard of hearing)    AIDS  . Hyperlipidemia   . Hypertension   . Metacarpal bone fracture   . Myocardial infarction ([place])    2009  . Osteoarthritis   . Osteoporosis   . Sinoatrial node dysfunction ([facility])     Review of systems:  Otherwise negative.    Physical Exam  Gen: Alert, oriented. Appears stated age.  HEENT: PERRLA. Lungs: No respiratory distress CV: RRR Abd: soft, benign, no masses Ext: No edema    Planned procedures: Proceed with EGD. The patient understands the nature of the planned procedure, indications, risks, alternatives and potential complications including but not limited to bleeding, infection, perforation, damage to internal organs and possible oversedation/side effects from anesthesia. The patient agrees and gives consent to proceed.  Please refer to procedure notes for findings, recommendations and patient disposition/instructions.     Raylene Miyamoto MD, MPH Gastroenterology 01/24/2021  9:21 AM

## 2021-01-24 NOTE — Interval H&P Note (Signed)
History and Physical Interval Note:  01/24/2021 9:24 AM  Lisa Gonzales  has presented today for surgery, with the diagnosis of Esophageal Stricture.  The various methods of treatment have been discussed with the patient and family. After consideration of risks, benefits and other options for treatment, the patient has consented to  Procedure(s): ESOPHAGOGASTRODUODENOSCOPY (EGD) WITH PROPOFOL (N/A) as a surgical intervention.  The patient's history has been reviewed, patient examined, no change in status, stable for surgery.  I have reviewed the patient's chart and labs.  Questions were answered to the patient's satisfaction.     [name]  Ok to proceed with EGD

## 2021-01-25 ENCOUNTER — Encounter: Payer: Self-pay | Admitting: Gastroenterology

## 2021-01-26 LAB — SURGICAL PATHOLOGY

## 2021-02-02 ENCOUNTER — Inpatient Hospital Stay: Admission: RE | Admit: 2021-02-02 | Payer: Medicare HMO | Source: Ambulatory Visit

## 2021-02-02 DIAGNOSIS — I10 Essential (primary) hypertension: Secondary | ICD-10-CM | POA: Diagnosis not present

## 2021-02-02 DIAGNOSIS — E782 Mixed hyperlipidemia: Secondary | ICD-10-CM | POA: Diagnosis not present

## 2021-02-02 DIAGNOSIS — I25118 Atherosclerotic heart disease of native coronary artery with other forms of angina pectoris: Secondary | ICD-10-CM | POA: Diagnosis not present

## 2021-02-03 ENCOUNTER — Ambulatory Visit
Admission: RE | Admit: 2021-02-03 | Discharge: 2021-02-03 | Disposition: A | Discharge status: Home / Self Care | Payer: Medicare HMO | Source: Ambulatory Visit | Attending: Internal Medicine | Admitting: Internal Medicine

## 2021-02-03 ENCOUNTER — Other Ambulatory Visit: Payer: Self-pay

## 2021-02-03 DIAGNOSIS — Z1231 Encounter for screening mammogram for malignant neoplasm of breast: Secondary | ICD-10-CM | POA: Diagnosis not present

## 2021-03-10 DIAGNOSIS — E7800 Pure hypercholesterolemia, unspecified: Secondary | ICD-10-CM | POA: Diagnosis not present

## 2021-03-10 DIAGNOSIS — R739 Hyperglycemia, unspecified: Secondary | ICD-10-CM | POA: Diagnosis not present

## 2021-03-10 DIAGNOSIS — I10 Essential (primary) hypertension: Secondary | ICD-10-CM | POA: Diagnosis not present

## 2021-03-10 DIAGNOSIS — Z79899 Other long term (current) drug therapy: Secondary | ICD-10-CM | POA: Diagnosis not present

## 2021-03-10 DIAGNOSIS — I25118 Atherosclerotic heart disease of native coronary artery with other forms of angina pectoris: Secondary | ICD-10-CM | POA: Diagnosis not present

## 2021-03-10 DIAGNOSIS — M3501 Sicca syndrome with keratoconjunctivitis: Secondary | ICD-10-CM | POA: Diagnosis not present

## 2021-06-22 DIAGNOSIS — E7800 Pure hypercholesterolemia, unspecified: Secondary | ICD-10-CM | POA: Diagnosis not present

## 2021-06-22 DIAGNOSIS — I10 Essential (primary) hypertension: Secondary | ICD-10-CM | POA: Diagnosis not present

## 2021-06-22 DIAGNOSIS — I25118 Atherosclerotic heart disease of native coronary artery with other forms of angina pectoris: Secondary | ICD-10-CM | POA: Diagnosis not present

## 2021-08-11 DIAGNOSIS — Z23 Encounter for immunization: Secondary | ICD-10-CM | POA: Diagnosis not present

## 2021-09-16 DIAGNOSIS — Z79899 Other long term (current) drug therapy: Secondary | ICD-10-CM | POA: Diagnosis not present

## 2021-09-16 DIAGNOSIS — Z0000 Encounter for general adult medical examination without abnormal findings: Secondary | ICD-10-CM | POA: Diagnosis not present

## 2021-09-16 DIAGNOSIS — E7800 Pure hypercholesterolemia, unspecified: Secondary | ICD-10-CM | POA: Diagnosis not present

## 2021-09-16 DIAGNOSIS — I10 Essential (primary) hypertension: Secondary | ICD-10-CM | POA: Diagnosis not present

## 2021-09-16 DIAGNOSIS — I25118 Atherosclerotic heart disease of native coronary artery with other forms of angina pectoris: Secondary | ICD-10-CM | POA: Diagnosis not present

## 2021-09-16 DIAGNOSIS — Z1211 Encounter for screening for malignant neoplasm of colon: Secondary | ICD-10-CM | POA: Diagnosis not present

## 2021-09-27 DIAGNOSIS — I10 Essential (primary) hypertension: Secondary | ICD-10-CM | POA: Diagnosis not present

## 2021-09-27 DIAGNOSIS — I25118 Atherosclerotic heart disease of native coronary artery with other forms of angina pectoris: Secondary | ICD-10-CM | POA: Diagnosis not present

## 2021-09-27 DIAGNOSIS — E7800 Pure hypercholesterolemia, unspecified: Secondary | ICD-10-CM | POA: Diagnosis not present

## 2021-09-27 DIAGNOSIS — Z79899 Other long term (current) drug therapy: Secondary | ICD-10-CM | POA: Diagnosis not present

## 2021-09-27 DIAGNOSIS — Z1211 Encounter for screening for malignant neoplasm of colon: Secondary | ICD-10-CM | POA: Diagnosis not present

## 2021-09-27 DIAGNOSIS — Z0000 Encounter for general adult medical examination without abnormal findings: Secondary | ICD-10-CM | POA: Diagnosis not present

## 2021-10-04 DIAGNOSIS — I252 Old myocardial infarction: Secondary | ICD-10-CM | POA: Diagnosis not present

## 2021-10-04 DIAGNOSIS — E7800 Pure hypercholesterolemia, unspecified: Secondary | ICD-10-CM | POA: Diagnosis not present

## 2021-10-04 DIAGNOSIS — I25118 Atherosclerotic heart disease of native coronary artery with other forms of angina pectoris: Secondary | ICD-10-CM | POA: Diagnosis not present

## 2021-10-04 DIAGNOSIS — I10 Essential (primary) hypertension: Secondary | ICD-10-CM | POA: Diagnosis not present

## 2021-12-16 DIAGNOSIS — Z79899 Other long term (current) drug therapy: Secondary | ICD-10-CM | POA: Diagnosis not present

## 2021-12-16 DIAGNOSIS — E785 Hyperlipidemia, unspecified: Secondary | ICD-10-CM | POA: Diagnosis not present

## 2021-12-16 DIAGNOSIS — I10 Essential (primary) hypertension: Secondary | ICD-10-CM | POA: Diagnosis not present

## 2021-12-16 DIAGNOSIS — R739 Hyperglycemia, unspecified: Secondary | ICD-10-CM | POA: Diagnosis not present

## 2021-12-16 DIAGNOSIS — I2510 Atherosclerotic heart disease of native coronary artery without angina pectoris: Secondary | ICD-10-CM | POA: Diagnosis not present

## 2021-12-16 DIAGNOSIS — Z0000 Encounter for general adult medical examination without abnormal findings: Secondary | ICD-10-CM | POA: Diagnosis not present

## 2021-12-16 DIAGNOSIS — Z1231 Encounter for screening mammogram for malignant neoplasm of breast: Secondary | ICD-10-CM | POA: Diagnosis not present

## 2021-12-16 DIAGNOSIS — M810 Age-related osteoporosis without current pathological fracture: Secondary | ICD-10-CM | POA: Diagnosis not present

## 2021-12-27 ENCOUNTER — Other Ambulatory Visit: Payer: Self-pay | Admitting: Internal Medicine

## 2021-12-27 DIAGNOSIS — Z1231 Encounter for screening mammogram for malignant neoplasm of breast: Secondary | ICD-10-CM

## 2022-01-10 DIAGNOSIS — I25118 Atherosclerotic heart disease of native coronary artery with other forms of angina pectoris: Secondary | ICD-10-CM | POA: Diagnosis not present

## 2022-01-10 DIAGNOSIS — E119 Type 2 diabetes mellitus without complications: Secondary | ICD-10-CM | POA: Diagnosis not present

## 2022-01-10 DIAGNOSIS — I10 Essential (primary) hypertension: Secondary | ICD-10-CM | POA: Diagnosis not present

## 2022-01-10 DIAGNOSIS — E782 Mixed hyperlipidemia: Secondary | ICD-10-CM | POA: Diagnosis not present

## 2022-02-07 ENCOUNTER — Ambulatory Visit
Admission: RE | Admit: 2022-02-07 | Discharge: 2022-02-07 | Disposition: A | Discharge status: Home / Self Care | Payer: Medicare HMO | Source: Ambulatory Visit | Attending: Internal Medicine | Admitting: Internal Medicine

## 2022-02-07 DIAGNOSIS — Z1231 Encounter for screening mammogram for malignant neoplasm of breast: Secondary | ICD-10-CM | POA: Diagnosis not present

## 2022-02-15 DIAGNOSIS — E782 Mixed hyperlipidemia: Secondary | ICD-10-CM | POA: Diagnosis not present

## 2022-02-15 DIAGNOSIS — Z79899 Other long term (current) drug therapy: Secondary | ICD-10-CM | POA: Diagnosis not present

## 2022-02-15 DIAGNOSIS — I10 Essential (primary) hypertension: Secondary | ICD-10-CM | POA: Diagnosis not present

## 2022-02-15 DIAGNOSIS — R739 Hyperglycemia, unspecified: Secondary | ICD-10-CM | POA: Diagnosis not present

## 2022-03-08 IMAGING — RF DG ESOPHAGUS
11 of 17 series · 14 of 24 positions shown · non-contrast
Comparison: None.

CLINICAL DATA: Pills and food getting stuck for 6 months

EXAM:
ESOPHOGRAM / BARIUM SWALLOW / BARIUM TABLET STUDY
TECHNIQUE: Combined double contrast and single contrast examination performed
using effervescent crystals, thick barium liquid, and thin barium
liquid. The patient was observed with fluoroscopy swallowing a 13 mm
barium sulphate tablet.
FLUOROSCOPY TIME:  Fluoroscopy Time:  0.7 minute
Radiation Exposure Index (if provided by the fluoroscopic device):
2.7 mGy
Number of Acquired Spot Images: 0

[Series 1: cp_standard · 0.25mm/px · 2 of 20 frames shown (1 of 11)]
[frame 2/20]
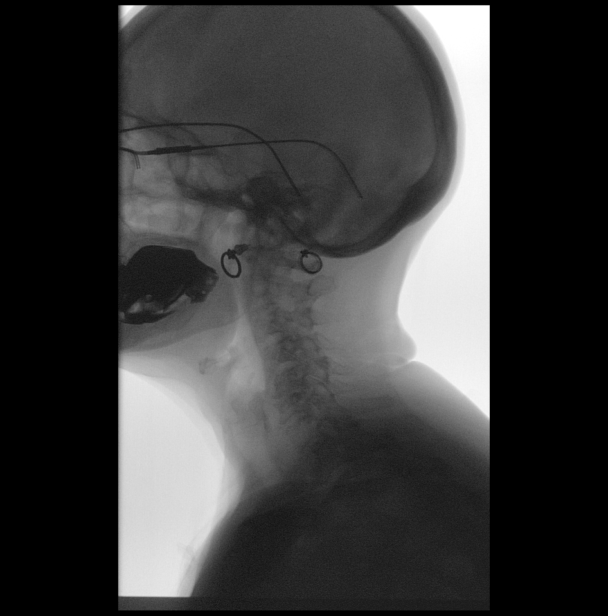
[frame 11/20]
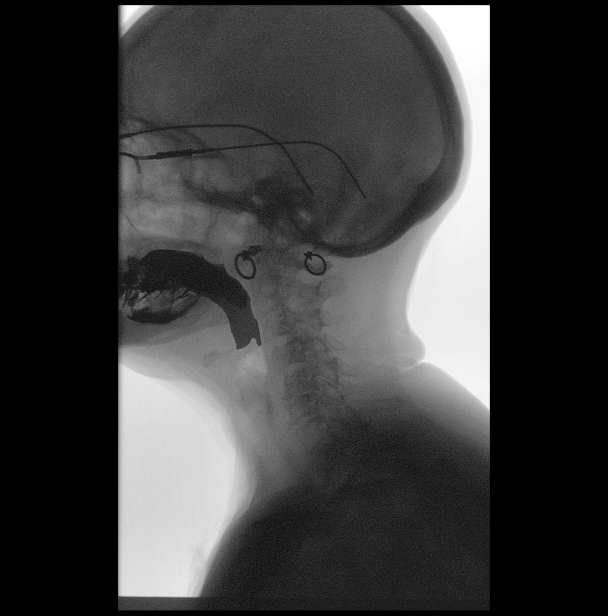

[Series 2: cp_standard · 0.25mm/px · 2 of 13 frames shown (2 of 11)]
[frame 7/13]
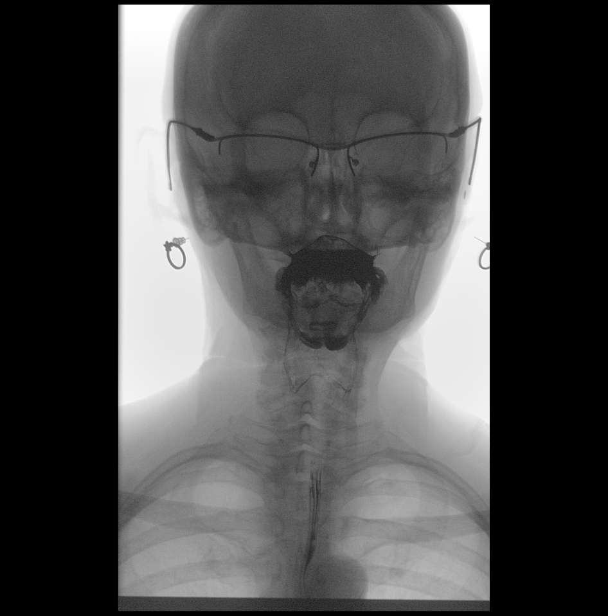
[frame 13/13]
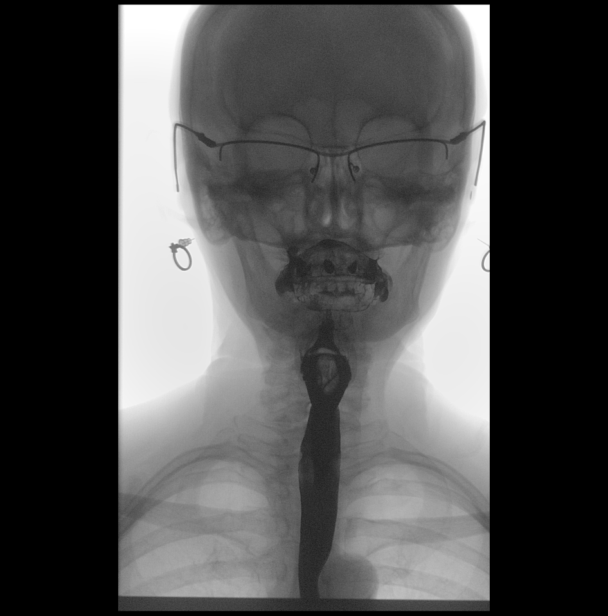

[Series 3: cp_standard · 0.25mm/px · 1 of 1 slices shown (3 of 11)]
[im 1/1]
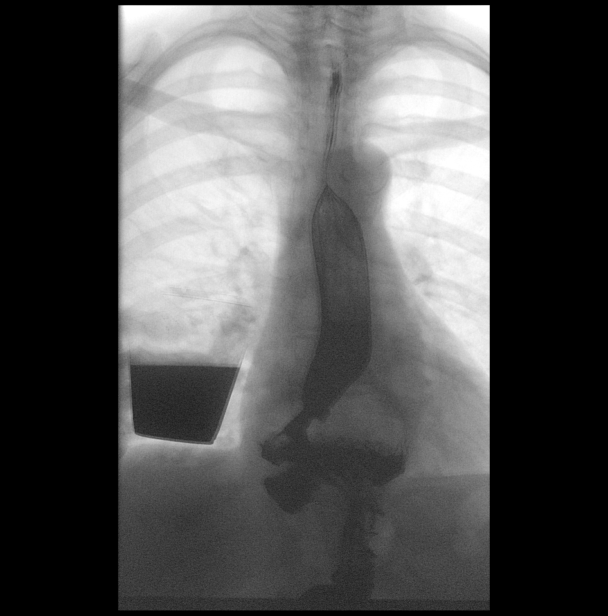

[Series 5: cp_standard · 0.25mm/px · 1 of 1 slices shown (4 of 11)]
[im 1/1]
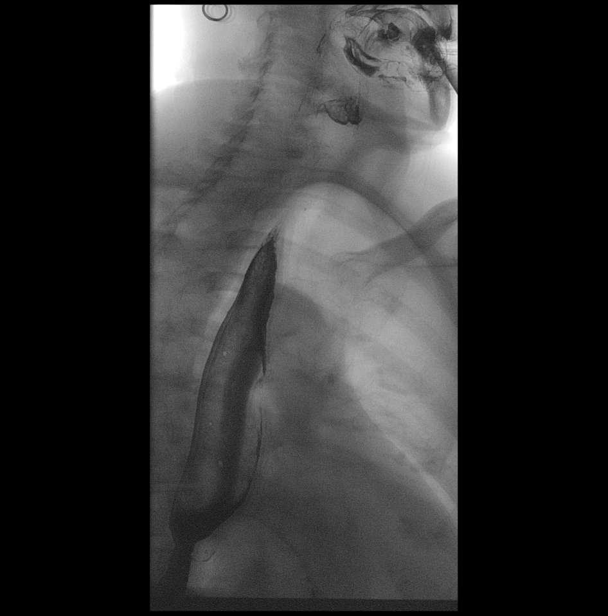

[Series 6: cp_standard · 0.25mm/px · 2 of 45 frames shown (5 of 11)]
[frame 7/45]
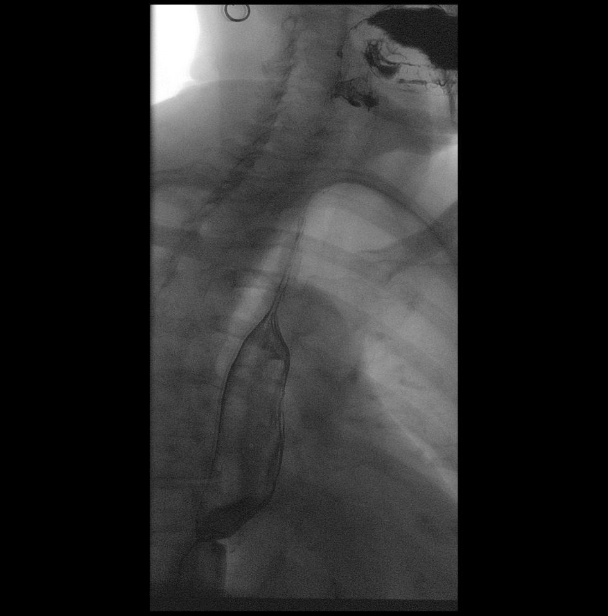
[frame 39/45]
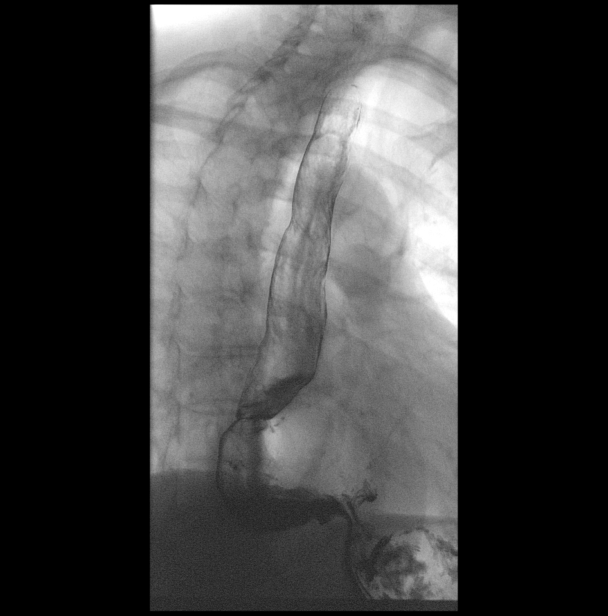

[Series 8: cp_standard · 0.25mm/px · 1 of 1 slices shown (6 of 11)]
[im 1/1]
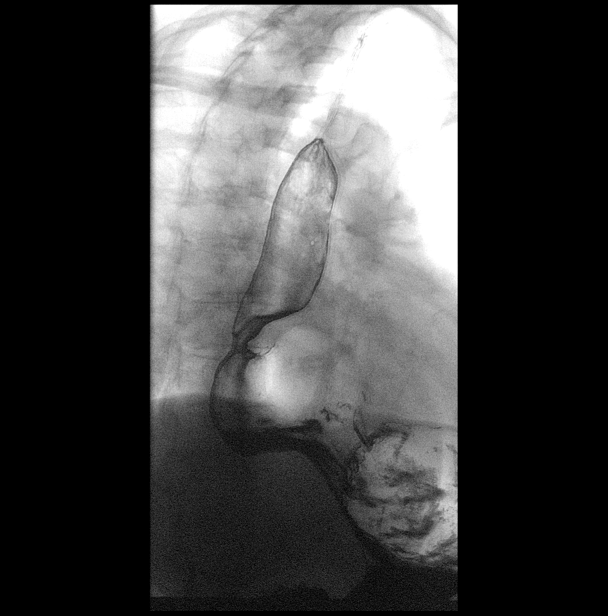

[Series 10: cp_standard · 0.28mm/px · 1 of 1 slices shown (7 of 11)]
[im 1/1]
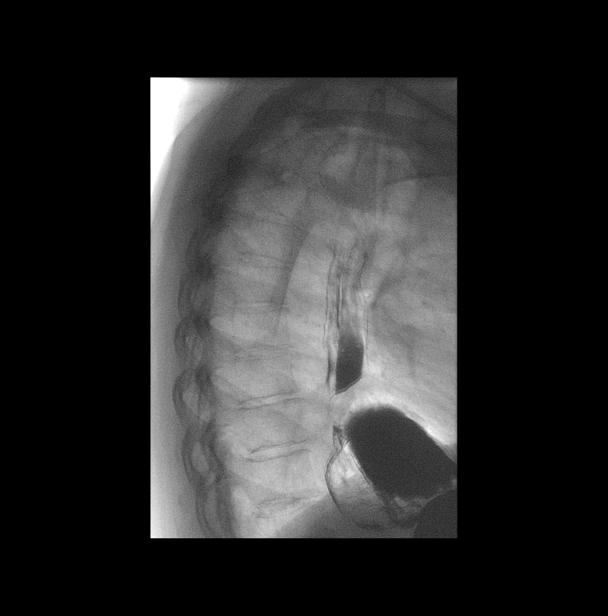

[Series 12: cp_standard · 0.28mm/px · 1 of 1 slices shown (8 of 11)]
[im 1/1]
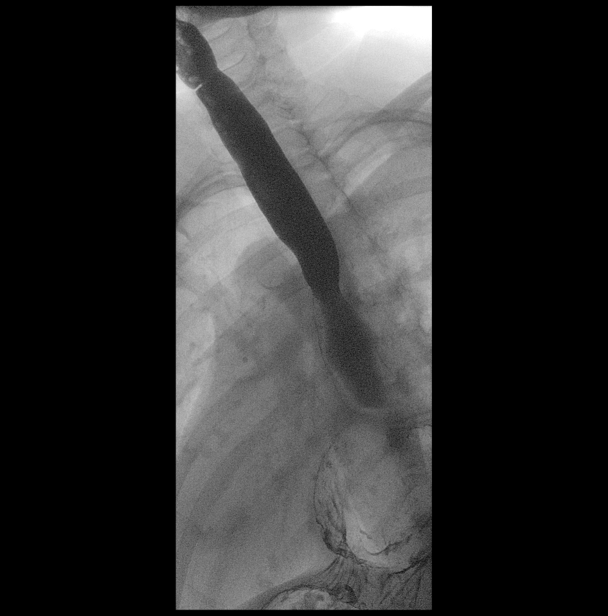

[Series 13: cp_standard · 0.28mm/px · 1 of 1 slices shown (9 of 11)]
[im 1/1]
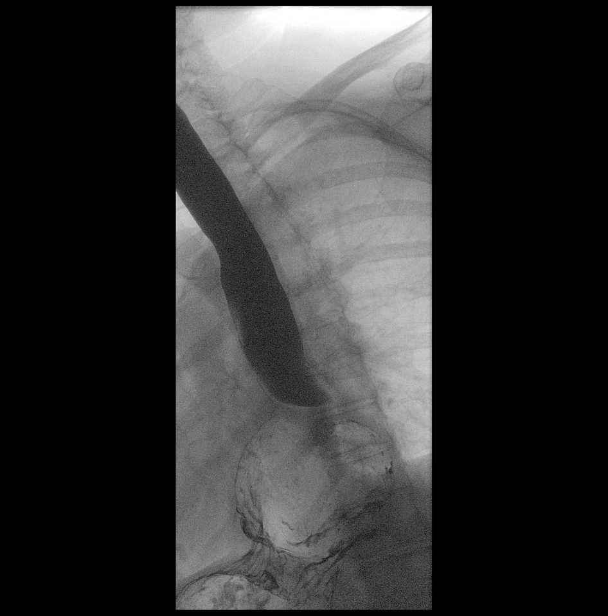

[Series 16: cp_standard · 0.25mm/px · 1 of 1 slices shown (10 of 11)]
[im 1/1]
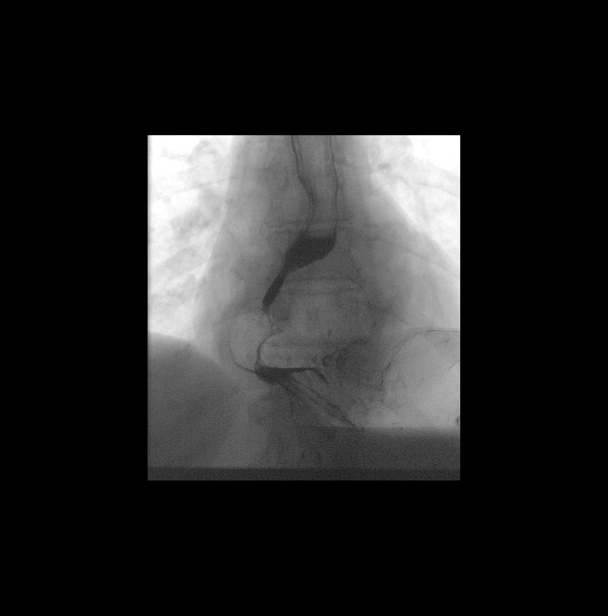

[Series 18: cp_standard · 0.25mm/px · 1 of 1 slices shown (11 of 11)]
[im 1/1]
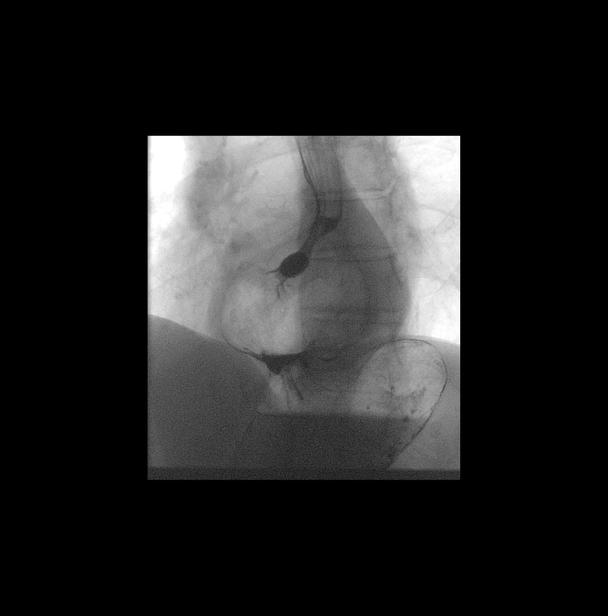

[14 of 24 positions shown; findings below may reference images not displayed]

FINDINGS: Normal pharyngeal anatomy and motility. Contrast flowed freely
through the esophagus without evidence of a mass. Stricture of the
distal esophagus at the gastroesophageal junction restricting the
passage of a barium tablet. Normal esophageal mucosa without
evidence of irregularity or ulceration. Esophageal motility was
normal. Moderate gastroesophageal reflux. Large hiatal hernia.
IMPRESSION: 1. Stricture of the distal esophagus at the gastroesophageal
junction restricting the passage of a barium tablet.
2. Moderate gastroesophageal reflux.
3. Large hiatal hernia.

## 2022-03-20 DIAGNOSIS — R69 Illness, unspecified: Secondary | ICD-10-CM | POA: Diagnosis not present

## 2022-03-20 DIAGNOSIS — K083 Retained dental root: Secondary | ICD-10-CM | POA: Diagnosis not present

## 2022-05-05 DIAGNOSIS — I10 Essential (primary) hypertension: Secondary | ICD-10-CM | POA: Diagnosis not present

## 2022-05-05 DIAGNOSIS — I25118 Atherosclerotic heart disease of native coronary artery with other forms of angina pectoris: Secondary | ICD-10-CM | POA: Diagnosis not present

## 2022-05-05 DIAGNOSIS — E782 Mixed hyperlipidemia: Secondary | ICD-10-CM | POA: Diagnosis not present

## 2022-05-09 DIAGNOSIS — E782 Mixed hyperlipidemia: Secondary | ICD-10-CM | POA: Diagnosis not present

## 2022-05-09 DIAGNOSIS — I25118 Atherosclerotic heart disease of native coronary artery with other forms of angina pectoris: Secondary | ICD-10-CM | POA: Diagnosis not present

## 2022-05-09 DIAGNOSIS — I10 Essential (primary) hypertension: Secondary | ICD-10-CM | POA: Diagnosis not present

## 2022-06-21 DIAGNOSIS — R7303 Prediabetes: Secondary | ICD-10-CM | POA: Diagnosis not present

## 2022-06-21 DIAGNOSIS — E7800 Pure hypercholesterolemia, unspecified: Secondary | ICD-10-CM | POA: Diagnosis not present

## 2022-06-21 DIAGNOSIS — I10 Essential (primary) hypertension: Secondary | ICD-10-CM | POA: Diagnosis not present

## 2022-06-21 DIAGNOSIS — M3501 Sicca syndrome with keratoconjunctivitis: Secondary | ICD-10-CM | POA: Diagnosis not present

## 2022-06-21 DIAGNOSIS — G2581 Restless legs syndrome: Secondary | ICD-10-CM | POA: Diagnosis not present

## 2022-06-21 DIAGNOSIS — Z79899 Other long term (current) drug therapy: Secondary | ICD-10-CM | POA: Diagnosis not present

## 2022-06-21 DIAGNOSIS — I25118 Atherosclerotic heart disease of native coronary artery with other forms of angina pectoris: Secondary | ICD-10-CM | POA: Diagnosis not present

## 2022-08-07 DIAGNOSIS — I10 Essential (primary) hypertension: Secondary | ICD-10-CM | POA: Diagnosis not present

## 2022-08-07 DIAGNOSIS — R42 Dizziness and giddiness: Secondary | ICD-10-CM | POA: Diagnosis not present

## 2022-08-09 DIAGNOSIS — H6121 Impacted cerumen, right ear: Secondary | ICD-10-CM | POA: Diagnosis not present

## 2022-09-18 DIAGNOSIS — E7800 Pure hypercholesterolemia, unspecified: Secondary | ICD-10-CM | POA: Diagnosis not present

## 2022-09-18 DIAGNOSIS — I10 Essential (primary) hypertension: Secondary | ICD-10-CM | POA: Diagnosis not present

## 2022-09-18 DIAGNOSIS — R8290 Unspecified abnormal findings in urine: Secondary | ICD-10-CM | POA: Diagnosis not present

## 2022-09-18 DIAGNOSIS — Z79899 Other long term (current) drug therapy: Secondary | ICD-10-CM | POA: Diagnosis not present

## 2022-09-18 DIAGNOSIS — R7303 Prediabetes: Secondary | ICD-10-CM | POA: Diagnosis not present

## 2022-09-25 DIAGNOSIS — Z79899 Other long term (current) drug therapy: Secondary | ICD-10-CM | POA: Diagnosis not present

## 2022-09-25 DIAGNOSIS — Z1231 Encounter for screening mammogram for malignant neoplasm of breast: Secondary | ICD-10-CM | POA: Diagnosis not present

## 2022-09-25 DIAGNOSIS — I10 Essential (primary) hypertension: Secondary | ICD-10-CM | POA: Diagnosis not present

## 2022-09-25 DIAGNOSIS — M810 Age-related osteoporosis without current pathological fracture: Secondary | ICD-10-CM | POA: Diagnosis not present

## 2022-09-25 DIAGNOSIS — R7303 Prediabetes: Secondary | ICD-10-CM | POA: Diagnosis not present

## 2022-09-25 DIAGNOSIS — E7800 Pure hypercholesterolemia, unspecified: Secondary | ICD-10-CM | POA: Diagnosis not present

## 2022-09-25 DIAGNOSIS — I25118 Atherosclerotic heart disease of native coronary artery with other forms of angina pectoris: Secondary | ICD-10-CM | POA: Diagnosis not present

## 2022-09-26 ENCOUNTER — Other Ambulatory Visit: Payer: Self-pay | Admitting: Internal Medicine

## 2022-09-26 DIAGNOSIS — Z1231 Encounter for screening mammogram for malignant neoplasm of breast: Secondary | ICD-10-CM

## 2022-11-16 DEATH — deceased

## 2023-02-05 DIAGNOSIS — M778 Other enthesopathies, not elsewhere classified: Secondary | ICD-10-CM | POA: Diagnosis not present

## 2023-02-12 ENCOUNTER — Ambulatory Visit
Admission: RE | Admit: 2023-02-12 | Discharge: 2023-02-12 | Disposition: A | Discharge status: Home / Self Care | Payer: Medicare HMO | Source: Ambulatory Visit | Attending: Internal Medicine | Admitting: Internal Medicine

## 2023-02-12 DIAGNOSIS — Z1231 Encounter for screening mammogram for malignant neoplasm of breast: Secondary | ICD-10-CM | POA: Insufficient documentation

## 2023-03-20 DIAGNOSIS — E7800 Pure hypercholesterolemia, unspecified: Secondary | ICD-10-CM | POA: Diagnosis not present

## 2023-03-20 DIAGNOSIS — I10 Essential (primary) hypertension: Secondary | ICD-10-CM | POA: Diagnosis not present

## 2023-03-20 DIAGNOSIS — Z79899 Other long term (current) drug therapy: Secondary | ICD-10-CM | POA: Diagnosis not present

## 2023-03-20 DIAGNOSIS — R7303 Prediabetes: Secondary | ICD-10-CM | POA: Diagnosis not present

## 2023-03-27 DIAGNOSIS — R1310 Dysphagia, unspecified: Secondary | ICD-10-CM | POA: Diagnosis not present

## 2023-03-27 DIAGNOSIS — E785 Hyperlipidemia, unspecified: Secondary | ICD-10-CM | POA: Diagnosis not present

## 2023-03-27 DIAGNOSIS — R7303 Prediabetes: Secondary | ICD-10-CM | POA: Diagnosis not present

## 2023-03-27 DIAGNOSIS — Z1211 Encounter for screening for malignant neoplasm of colon: Secondary | ICD-10-CM | POA: Diagnosis not present

## 2023-03-27 DIAGNOSIS — I10 Essential (primary) hypertension: Secondary | ICD-10-CM | POA: Diagnosis not present

## 2023-03-27 DIAGNOSIS — I2510 Atherosclerotic heart disease of native coronary artery without angina pectoris: Secondary | ICD-10-CM | POA: Diagnosis not present

## 2023-04-13 DIAGNOSIS — Z1211 Encounter for screening for malignant neoplasm of colon: Secondary | ICD-10-CM | POA: Diagnosis not present

## 2023-04-17 DIAGNOSIS — R1319 Other dysphagia: Secondary | ICD-10-CM | POA: Diagnosis not present

## 2023-04-17 DIAGNOSIS — K449 Diaphragmatic hernia without obstruction or gangrene: Secondary | ICD-10-CM | POA: Diagnosis not present

## 2023-04-17 DIAGNOSIS — Z8719 Personal history of other diseases of the digestive system: Secondary | ICD-10-CM | POA: Diagnosis not present

## 2023-04-26 ENCOUNTER — Other Ambulatory Visit: Payer: Self-pay | Admitting: Gastroenterology

## 2023-04-26 DIAGNOSIS — Z8719 Personal history of other diseases of the digestive system: Secondary | ICD-10-CM

## 2023-04-26 DIAGNOSIS — R1319 Other dysphagia: Secondary | ICD-10-CM

## 2023-04-26 DIAGNOSIS — K449 Diaphragmatic hernia without obstruction or gangrene: Secondary | ICD-10-CM

## 2023-04-30 ENCOUNTER — Ambulatory Visit
Admission: RE | Admit: 2023-04-30 | Discharge: 2023-04-30 | Disposition: A | Discharge status: Home / Self Care | Payer: Medicare HMO | Source: Ambulatory Visit | Attending: Gastroenterology | Admitting: Gastroenterology

## 2023-04-30 ENCOUNTER — Other Ambulatory Visit: Payer: Self-pay | Admitting: Gastroenterology

## 2023-04-30 DIAGNOSIS — R1319 Other dysphagia: Secondary | ICD-10-CM | POA: Insufficient documentation

## 2023-04-30 DIAGNOSIS — K222 Esophageal obstruction: Secondary | ICD-10-CM | POA: Diagnosis not present

## 2023-04-30 DIAGNOSIS — Z8719 Personal history of other diseases of the digestive system: Secondary | ICD-10-CM | POA: Insufficient documentation

## 2023-04-30 DIAGNOSIS — K449 Diaphragmatic hernia without obstruction or gangrene: Secondary | ICD-10-CM

## 2023-05-10 ENCOUNTER — Encounter: Payer: Self-pay | Admitting: *Deleted

## 2023-05-11 ENCOUNTER — Ambulatory Visit
Admission: RE | Admit: 2023-05-11 | Discharge: 2023-05-11 | Disposition: A | Discharge status: Home / Self Care | Payer: Medicare HMO | Source: Home / Self Care | Attending: Gastroenterology | Admitting: Gastroenterology

## 2023-05-11 ENCOUNTER — Ambulatory Visit: Payer: Medicare HMO

## 2023-05-11 ENCOUNTER — Encounter
Admission: RE | Disposition: A | Discharge status: Home / Self Care | Payer: Self-pay | Source: Home / Self Care | Attending: Gastroenterology

## 2023-05-11 DIAGNOSIS — K449 Diaphragmatic hernia without obstruction or gangrene: Secondary | ICD-10-CM | POA: Diagnosis not present

## 2023-05-11 DIAGNOSIS — M1990 Unspecified osteoarthritis, unspecified site: Secondary | ICD-10-CM | POA: Insufficient documentation

## 2023-05-11 DIAGNOSIS — R1310 Dysphagia, unspecified: Secondary | ICD-10-CM | POA: Diagnosis not present

## 2023-05-11 DIAGNOSIS — E785 Hyperlipidemia, unspecified: Secondary | ICD-10-CM | POA: Diagnosis not present

## 2023-05-11 DIAGNOSIS — I10 Essential (primary) hypertension: Secondary | ICD-10-CM | POA: Diagnosis not present

## 2023-05-11 DIAGNOSIS — M810 Age-related osteoporosis without current pathological fracture: Secondary | ICD-10-CM | POA: Insufficient documentation

## 2023-05-11 DIAGNOSIS — Z955 Presence of coronary angioplasty implant and graft: Secondary | ICD-10-CM | POA: Diagnosis not present

## 2023-05-11 DIAGNOSIS — I2510 Atherosclerotic heart disease of native coronary artery without angina pectoris: Secondary | ICD-10-CM | POA: Diagnosis not present

## 2023-05-11 DIAGNOSIS — Z85828 Personal history of other malignant neoplasm of skin: Secondary | ICD-10-CM | POA: Diagnosis not present

## 2023-05-11 DIAGNOSIS — K222 Esophageal obstruction: Secondary | ICD-10-CM | POA: Diagnosis not present

## 2023-05-11 DIAGNOSIS — K219 Gastro-esophageal reflux disease without esophagitis: Secondary | ICD-10-CM | POA: Insufficient documentation

## 2023-05-11 DIAGNOSIS — K2970 Gastritis, unspecified, without bleeding: Secondary | ICD-10-CM | POA: Diagnosis not present

## 2023-05-11 DIAGNOSIS — Z79899 Other long term (current) drug therapy: Secondary | ICD-10-CM | POA: Insufficient documentation

## 2023-05-11 DIAGNOSIS — R933 Abnormal findings on diagnostic imaging of other parts of digestive tract: Secondary | ICD-10-CM | POA: Diagnosis not present

## 2023-05-11 DIAGNOSIS — I252 Old myocardial infarction: Secondary | ICD-10-CM | POA: Diagnosis not present

## 2023-05-11 HISTORY — PX: ESOPHAGOGASTRODUODENOSCOPY (EGD) WITH PROPOFOL: SHX5813

## 2023-05-11 SURGERY — ESOPHAGOGASTRODUODENOSCOPY (EGD) WITH PROPOFOL
Anesthesia: General

## 2023-05-11 MED ORDER — LIDOCAINE HCL (PF) 2 % IJ SOLN
INTRAMUSCULAR | Status: AC
  Filled 2023-05-11: qty 5

## 2023-05-11 MED ORDER — LIDOCAINE HCL (CARDIAC) PF 100 MG/5ML IV SOSY
PREFILLED_SYRINGE | INTRAVENOUS | Status: DC | PRN
  Administered 2023-05-11: 40 mg via INTRAVENOUS

## 2023-05-11 MED ORDER — PROPOFOL 10 MG/ML IV BOLUS
INTRAVENOUS | Status: AC
  Filled 2023-05-11: qty 40

## 2023-05-11 MED ORDER — SODIUM CHLORIDE 0.9 % IV SOLN: INTRAVENOUS | Status: DC

## 2023-05-11 MED ORDER — PROPOFOL 10 MG/ML IV BOLUS
INTRAVENOUS | Status: DC | PRN
  Administered 2023-05-11 (×2): 20 mg via INTRAVENOUS
  Administered 2023-05-11: 50 mg via INTRAVENOUS

## 2023-05-11 NOTE — Interval H&P Note (Signed)
History and Physical Interval Note:  05/11/2023 12:23 PM  Lisa Gonzales  has presented today for surgery, with the diagnosis of Dysphagia.  The various methods of treatment have been discussed with the patient and family. After consideration of risks, benefits and other options for treatment, the patient has consented to  Procedure(s): ESOPHAGOGASTRODUODENOSCOPY (EGD) WITH PROPOFOL (N/A) as a surgical intervention.  The patient's history has been reviewed, patient examined, no change in status, stable for surgery.  I have reviewed the patient's chart and labs.  Questions were answered to the patient's satisfaction.     Regis Bill  Ok to proceed with EGD

## 2023-05-11 NOTE — Transfer of Care (Signed)
Immediate Anesthesia Transfer of Care Note  Patient: Arbie Huffines Weyenberg  Procedure(s) Performed: ESOPHAGOGASTRODUODENOSCOPY (EGD) WITH PROPOFOL Balloon dilation wire-guided  Patient Location: PACU  Anesthesia Type:MAC  Level of Consciousness: sedated  Airway & Oxygen Therapy: Patient Spontanous Breathing  Post-op Assessment: Report given to RN and Post -op Vital signs reviewed and stable  Post vital signs: Reviewed and stable  Last Vitals:  Vitals Value Taken Time  BP 153/47 05/11/23 1241  Temp 35.9 C 05/11/23 1241  Pulse 56 05/11/23 1241  Resp 18 05/11/23 1241  SpO2 98 % 05/11/23 1241  Vitals shown include unfiled device data.  Last Pain:  Vitals:   05/11/23 1241  TempSrc: Temporal  PainSc: Asleep         Complications: No notable events documented.

## 2023-05-11 NOTE — Op Note (Signed)
[facility]enter[facility] Patient Name: Lisa Gonzales Procedure Date: 05/11/2023 11:37 AM MRN: [id number] Account #: [id number] Date of Birth: 1935-03-13 Admit Type: Outpatient Age: 88 Room: [facility] ENDO ROOM 3 Gender: Female Note Status: Finalized Instrument Name: Upper Endoscope [id number] Procedure:             Upper GI endoscopy Indications:           Dysphagia, Abnormal UGI series Providers:             Eather Colas MD, MD Referring MD:          Duane Lope. Judithann Sheen, MD (Referring MD) Medicines:             Monitored Anesthesia Care Complications:         No immediate complications. Estimated blood loss:                         Minimal. Procedure:             Pre-Anesthesia Assessment:                        - Prior to the procedure, a History and Physical was                         performed, and patient medications and allergies were                         reviewed. The patient is competent. The risks and                         benefits of the procedure and the sedation options and                         risks were discussed with the patient. All questions                         were answered and informed consent was obtained.                         Patient identification and proposed procedure were                         verified by the physician, the nurse, the                         anesthesiologist, the anesthetist and the technician                         in the endoscopy suite. Mental Status Examination:                         alert and oriented. Airway Examination: normal                         oropharyngeal airway and neck mobility. Respiratory                         Examination: clear to auscultation. CV Examination:  normal. Prophylactic Antibiotics: The patient does not                         require prophylactic antibiotics. Prior                         Anticoagulants: The patient has taken no anticoagulant                          or antiplatelet agents. ASA Grade Assessment: III - A                         patient with severe systemic disease. After reviewing                         the risks and benefits, the patient was deemed in                         satisfactory condition to undergo the procedure. The                         anesthesia plan was to use monitored anesthesia care                         (MAC). Immediately prior to administration of                         medications, the patient was re-assessed for adequacy                         to receive sedatives. The heart rate, respiratory                         rate, oxygen saturations, blood pressure, adequacy of                         pulmonary ventilation, and response to care were                         monitored throughout the procedure. The physical                         status of the patient was re-assessed after the                         procedure.                        After obtaining informed consent, the endoscope was                         passed under direct vision. Throughout the procedure,                         the patient's blood pressure, pulse, and oxygen                         saturations were monitored continuously. The Endoscope  was introduced through the mouth, and advanced to the                         second part of duodenum. The upper GI endoscopy was                         accomplished without difficulty. The patient tolerated                         the procedure well. Findings:      A low-grade of narrowing Schatzki ring was found in the lower third of       the esophagus. A TTS dilator was passed through the scope. Dilation with       a 15-16.5-18 mm balloon dilator was performed to 18 mm. The dilation       site was examined and showed moderate mucosal disruption. Estimated       blood loss was minimal.      A large hiatal hernia was present.      Patchy mild  inflammation characterized by erythema was found in the       gastric body. This has been biopsied before and is unremarkable.      The examined duodenum was normal. Impression:            - Low-grade of narrowing Schatzki ring. Dilated.                        - Large hiatal hernia.                        - Gastritis.                        - Normal examined duodenum.                        - No specimens collected. Recommendation:        - Discharge patient to home.                        - Resume previous diet.                        - Continue present medications.                        - Return to referring physician as previously                         scheduled. Procedure Code(s):     --- Professional ---                        [phone], Esophagogastroduodenoscopy, flexible,                         transoral; with transendoscopic balloon dilation of                         esophagus (less than 30 mm diameter) Diagnosis Code(s):     --- Professional ---  K22.2, Esophageal obstruction                        K44.9, Diaphragmatic hernia without obstruction or                         gangrene                        K29.70, Gastritis, unspecified, without bleeding                        R13.10, Dysphagia, unspecified                        R93.3, Abnormal findings on diagnostic imaging of                         other parts of digestive tract CPT copyright 2022 [employer]. All rights reserved. The codes documented in this report are preliminary and upon coder review may  be revised to meet current compliance requirements. Eather Colas MD, MD 05/11/2023 12:44:01 PM Number of Addenda: 0 Note Initiated On: 05/11/2023 11:37 AM Estimated Blood Loss:  Estimated blood loss was minimal.      [facility]

## 2023-05-11 NOTE — H&P (Signed)
Outpatient short stay form Pre-procedure 05/11/2023  Regis Bill, MD  Primary Physician: Marguarite Arbour, MD  Reason for visit:  Dysphagia/Abnormal imaging  History of present illness:    88 y/o lady with history of hypertension, HLD, and large hiatal hernia here for EGD for dysphagia and abnormal barium swallow. No blood thinners except aspirin. No family history of GI malignancies.    Current Facility-Administered Medications:    0.9 %  sodium chloride infusion, , Intravenous, Continuous, Azaryah Heathcock, Rossie Muskrat, MD, Last Rate: 20 mL/hr at 05/11/23 1137, New Bag at 05/11/23 1137  Medications Prior to Admission  Medication Sig Dispense Refill Last Dose   amLODipine (NORVASC) 5 MG tablet Take 5 mg by mouth daily.   05/10/2023   aspirin EC 81 MG tablet Take 81 mg by mouth daily.   05/10/2023   atorvastatin (LIPITOR) 10 MG tablet Take 10 mg by mouth daily.   05/10/2023   lisinopril (PRINIVIL,ZESTRIL) 40 MG tablet Take 40 mg by mouth 2 (two) times daily.   05/10/2023   losartan-hydrochlorothiazide (HYZAAR) 100-12.5 MG tablet Take 1 tablet by mouth daily.   05/10/2023   metoprolol tartrate (LOPRESSOR) 25 MG tablet Take 50 mg by mouth 2 (two) times daily.   05/10/2023   pantoprazole (PROTONIX) 20 MG tablet Take 20 mg by mouth daily.   05/10/2023   acetaminophen (TYLENOL) 325 MG tablet Take 650 mg by mouth every 6 (six) hours as needed for moderate pain or mild pain.      Artificial Tear Solution (TEARS NATURALE OP) Apply to eye daily as needed (dry eyes).      Multiple Vitamin (MULTIVITAMIN ADULT PO) Take by mouth daily.      Omega-3 Fatty Acids (EQL OMEGA 3 FISH OIL PO) Take by mouth daily.      vitamin E 180 MG (400 UNITS) capsule Take 400 Units by mouth daily.        Allergies  Allergen Reactions   Zocor [Simvastatin]      Past Medical History:  Diagnosis Date   Anemia    Arthritis    Cancer (HCC)    skin ca   Cataract cortical, senile    Coronary artery disease    Coronary  atherosclerosis of native coronary artery    Diverticulosis    GERD (gastroesophageal reflux disease)    HOH (hard of hearing)    AIDS   Hyperlipidemia    Hypertension    Metacarpal bone fracture    Myocardial infarction [facility])    2009   Osteoarthritis    Osteoporosis    Sinoatrial node dysfunction (HCC)     Review of systems:  Otherwise negative.    Physical Exam  Gen: Alert, oriented. Appears stated age.  HEENT: PERRLA. Lungs: No respiratory distress CV: RRR Abd: soft, benign, no masses Ext: No edema    Planned procedures: Proceed with EGD. The patient understands the nature of the planned procedure, indications, risks, alternatives and potential complications including but not limited to bleeding, infection, perforation, damage to internal organs and possible oversedation/side effects from anesthesia. The patient agrees and gives consent to proceed.  Please refer to procedure notes for findings, recommendations and patient disposition/instructions.     Regis Bill, MD [facility] Gastroenterology

## 2023-05-11 NOTE — Anesthesia Preprocedure Evaluation (Signed)
Anesthesia Evaluation  Patient identified by MRN, date of birth, ID band Patient awake    Reviewed: Allergy & Precautions, NPO status , Patient's Chart, lab work & pertinent test results  Airway Mallampati: II  TM Distance: >3 FB Neck ROM: full    Dental  (+) Upper Dentures, Lower Dentures   Pulmonary neg pulmonary ROS   Pulmonary exam normal breath sounds clear to auscultation       Cardiovascular Exercise Tolerance: Good hypertension, Pt. on medications + CAD, + Past MI and + Cardiac Stents  negative cardio ROS Normal cardiovascular exam Rhythm:Regular Rate:Normal     Neuro/Psych negative neurological ROS  negative psych ROS   GI/Hepatic negative GI ROS, Neg liver ROS,GERD  ,,  Endo/Other  negative endocrine ROS    Renal/GU negative Renal ROS  negative genitourinary   Musculoskeletal   Abdominal Normal abdominal exam  (+)   Peds negative pediatric ROS (+)  Hematology negative hematology ROS (+) Blood dyscrasia, anemia   Anesthesia Other Findings Past Medical History: No date: Anemia No date: Arthritis No date: Cancer (HCC)     Comment:  skin ca No date: Cataract cortical, senile No date: Coronary artery disease No date: Coronary atherosclerosis of native coronary artery No date: Diverticulosis No date: GERD (gastroesophageal reflux disease) No date: HOH (hard of hearing)     Comment:  AIDS No date: Hyperlipidemia No date: Hypertension No date: Metacarpal bone fracture No date: Myocardial infarction [facility]     Comment:  2009 No date: Osteoarthritis No date: Osteoporosis No date: Sinoatrial node dysfunction (HCC)  Past Surgical History: No date: ABDOMINAL HYSTERECTOMY 03/15/2017: CATARACT EXTRACTION W/PHACO; Left     Comment:  Procedure: CATARACT EXTRACTION PHACO AND INTRAOCULAR               LENS PLACEMENT (IOC);  Surgeon: Nevada Crane, MD;                Location: [facility] ORS;  Service:  Ophthalmology;  Laterality:              Left;  US00:35.5 AP8.6 CDE3.06 FLUID LOT # [id number] H 05/10/2017: CATARACT EXTRACTION W/PHACO; Right     Comment:  Procedure: CATARACT EXTRACTION PHACO AND INTRAOCULAR               LENS PLACEMENT (IOC);  Surgeon: Nevada Crane, MD;                Location: [facility] ORS;  Service: Ophthalmology;  Laterality:              Right;  Lot # [id number] H [place]: 00:42.8 AP%: 9.1 CDE: 3.89 No date: CORONARY ANGIOPLASTY     Comment:  STENT No date: DILATION AND CURETTAGE OF UTERUS 11/19/2020: ESOPHAGOGASTRODUODENOSCOPY; N/A     Comment:  Procedure: ESOPHAGOGASTRODUODENOSCOPY (EGD);  Surgeon:               Regis Bill, MD;  Location: [facility] ENDOSCOPY;                Service: Endoscopy;  Laterality: N/A; 01/24/2021: ESOPHAGOGASTRODUODENOSCOPY (EGD) WITH PROPOFOL; N/A     Comment:  Procedure: ESOPHAGOGASTRODUODENOSCOPY (EGD) WITH               PROPOFOL;  Surgeon: Regis Bill, MD;  Location:               [facility] ENDOSCOPY;  Service: Endoscopy;  Laterality: N/A; No date: EYE SURGERY  BMI    Body Mass Index: 26.37 kg/m  Reproductive/Obstetrics negative OB ROS                             Anesthesia Physical Anesthesia Plan  ASA: 3  Anesthesia Plan: General   Post-op Pain Management:    Induction: Intravenous  PONV Risk Score and Plan: Propofol infusion and TIVA  Airway Management Planned: Natural Airway  Additional Equipment:   Intra-op Plan:   Post-operative Plan:   Informed Consent: I have reviewed the patients History and Physical, chart, labs and discussed the procedure including the risks, benefits and alternatives for the proposed anesthesia with the patient or authorized representative who has indicated his/her understanding and acceptance.     Dental Advisory Given  Plan Discussed with: CRNA and Surgeon  Anesthesia Plan Comments:        Anesthesia Quick Evaluation

## 2023-05-11 NOTE — Anesthesia Postprocedure Evaluation (Signed)
Anesthesia Post Note  Patient: Lisa Gonzales  Procedure(s) Performed: ESOPHAGOGASTRODUODENOSCOPY (EGD) WITH PROPOFOL Balloon dilation wire-guided  Patient location during evaluation: PACU Anesthesia Type: General Level of consciousness: awake and awake and alert Pain management: pain level controlled Vital Signs Assessment: post-procedure vital signs reviewed and stable Cardiovascular status: stable Anesthetic complications: no   No notable events documented.   Last Vitals:  Vitals:   05/11/23 1251 05/11/23 1301  BP: (!) 139/110 (!) 173/75  Pulse: (!) 57 (!) 57  Resp: (!) 21 (!) 21  Temp:    SpO2: 100% 99%    Last Pain:  Vitals:   05/11/23 1301  TempSrc:   PainSc: 0-No pain                 VAN STAVEREN,Jeslynn Hollander

## 2023-05-14 ENCOUNTER — Encounter: Payer: Self-pay | Admitting: Gastroenterology

## 2023-05-17 DIAGNOSIS — I10 Essential (primary) hypertension: Secondary | ICD-10-CM | POA: Diagnosis not present

## 2023-05-17 DIAGNOSIS — E782 Mixed hyperlipidemia: Secondary | ICD-10-CM | POA: Diagnosis not present

## 2023-05-17 DIAGNOSIS — Z8719 Personal history of other diseases of the digestive system: Secondary | ICD-10-CM | POA: Diagnosis not present

## 2023-05-17 DIAGNOSIS — I2510 Atherosclerotic heart disease of native coronary artery without angina pectoris: Secondary | ICD-10-CM | POA: Diagnosis not present

## 2023-05-17 DIAGNOSIS — K219 Gastro-esophageal reflux disease without esophagitis: Secondary | ICD-10-CM | POA: Diagnosis not present

## 2023-05-17 DIAGNOSIS — E663 Overweight: Secondary | ICD-10-CM | POA: Diagnosis not present

## 2023-05-17 DIAGNOSIS — Z9861 Coronary angioplasty status: Secondary | ICD-10-CM | POA: Diagnosis not present

## 2023-05-17 DIAGNOSIS — R1319 Other dysphagia: Secondary | ICD-10-CM | POA: Diagnosis not present

## 2023-06-15 DIAGNOSIS — M542 Cervicalgia: Secondary | ICD-10-CM | POA: Diagnosis not present

## 2023-07-31 DIAGNOSIS — M810 Age-related osteoporosis without current pathological fracture: Secondary | ICD-10-CM | POA: Diagnosis not present

## 2023-07-31 DIAGNOSIS — Z23 Encounter for immunization: Secondary | ICD-10-CM | POA: Diagnosis not present

## 2023-07-31 DIAGNOSIS — R7303 Prediabetes: Secondary | ICD-10-CM | POA: Diagnosis not present

## 2023-07-31 DIAGNOSIS — Z0000 Encounter for general adult medical examination without abnormal findings: Secondary | ICD-10-CM | POA: Diagnosis not present

## 2023-07-31 DIAGNOSIS — E785 Hyperlipidemia, unspecified: Secondary | ICD-10-CM | POA: Diagnosis not present

## 2023-07-31 DIAGNOSIS — Z79899 Other long term (current) drug therapy: Secondary | ICD-10-CM | POA: Diagnosis not present

## 2023-07-31 DIAGNOSIS — I10 Essential (primary) hypertension: Secondary | ICD-10-CM | POA: Diagnosis not present

## 2023-11-26 DIAGNOSIS — Z0000 Encounter for general adult medical examination without abnormal findings: Secondary | ICD-10-CM | POA: Diagnosis not present

## 2023-11-26 DIAGNOSIS — R7303 Prediabetes: Secondary | ICD-10-CM | POA: Diagnosis not present

## 2023-11-26 DIAGNOSIS — I2510 Atherosclerotic heart disease of native coronary artery without angina pectoris: Secondary | ICD-10-CM | POA: Diagnosis not present

## 2023-11-26 DIAGNOSIS — I10 Essential (primary) hypertension: Secondary | ICD-10-CM | POA: Diagnosis not present

## 2023-11-26 DIAGNOSIS — Z79899 Other long term (current) drug therapy: Secondary | ICD-10-CM | POA: Diagnosis not present

## 2023-11-26 DIAGNOSIS — E782 Mixed hyperlipidemia: Secondary | ICD-10-CM | POA: Diagnosis not present

## 2024-02-19 ENCOUNTER — Ambulatory Visit: Payer: Medicare HMO | Attending: Cardiovascular Disease | Admitting: Cardiovascular Disease

## 2024-02-19 ENCOUNTER — Encounter: Payer: Self-pay | Admitting: Cardiovascular Disease

## 2024-02-19 VITALS — BP 216/80 | HR 64 | Ht 60.0 in | Wt 134.2 lb

## 2024-02-19 DIAGNOSIS — I10 Essential (primary) hypertension: Secondary | ICD-10-CM | POA: Diagnosis not present

## 2024-02-19 DIAGNOSIS — I2510 Atherosclerotic heart disease of native coronary artery without angina pectoris: Secondary | ICD-10-CM

## 2024-02-19 DIAGNOSIS — E785 Hyperlipidemia, unspecified: Secondary | ICD-10-CM

## 2024-02-19 MED ORDER — LOSARTAN POTASSIUM 100 MG PO TABS: 100.0000 mg | ORAL_TABLET | Freq: Every day | ORAL | 1 refills | Status: DC

## 2024-02-19 NOTE — Progress Notes (Unsigned)
 Cardiology Office Note   Date:  02/20/2024   ID:  Lisa Gonzales, DOB [birth year], MRN [id number]  PCP:  Yehuda Helms, MD  Cardiologist:   Antionette Kirks, MD   No chief complaint on file.     History of Present Illness: Lisa Gonzales is a 89 y.o. female who presents to establish cardiovascular care.  I recently took care of her husband who presented with myocardial infarction. She has known history of coronary artery disease status post myocardial infarction and stent placement in 2009, essential hypertension, hyperlipidemia and prediabetes.  No cardiac events since 2009. I did reviewed her cardiac catheterization images from 2009.  There was mild nonobstructive disease affecting the LAD.  The RCA had 99% proximal stenosis with faint left-to-right collaterals.  The RCA stenosis was treated successfully with PCI and drug-eluting stent placement. She has been doing well with no chest pain, shortness of breath or palpitations.  She takes her medications regularly and does report that her blood pressure has been elevated even at home.  Initial blood pressure today was 216/80 and upon repeat it was 192/80.     Past Medical History:  Diagnosis Date   Anemia    Arthritis    Cancer (HCC)    skin ca   Cataract cortical, senile    Coronary artery disease    Coronary atherosclerosis of native coronary artery    Diverticulosis    GERD (gastroesophageal reflux disease)    HOH (hard of hearing)    AIDS   Hyperlipidemia    Hypertension    Metacarpal bone fracture    Myocardial infarction [facility])    2009   Osteoarthritis    Osteoporosis    Sinoatrial node dysfunction [facility])     Past Surgical History:  Procedure Laterality Date   ABDOMINAL HYSTERECTOMY     CATARACT EXTRACTION W/PHACO Left 03/15/2017   Procedure: CATARACT EXTRACTION PHACO AND INTRAOCULAR LENS PLACEMENT (IOC);  Surgeon: Rosa College, MD;  Location: [facility] ORS;  Service: Ophthalmology;  Laterality: Left;   US00:35.5 AP8.6 CDE3.06 FLUID LOT # [id number]   CATARACT EXTRACTION W/PHACO Right 05/10/2017   Procedure: CATARACT EXTRACTION PHACO AND INTRAOCULAR LENS PLACEMENT (IOC);  Surgeon: Rosa College, MD;  Location: [facility] ORS;  Service: Ophthalmology;  Laterality: Right;  Lot # [id number] H US : 00:42.8 AP%: 9.1 CDE: 3.89   CORONARY ANGIOPLASTY     STENT   DILATION AND CURETTAGE OF UTERUS     ESOPHAGOGASTRODUODENOSCOPY N/A 11/19/2020   Procedure: ESOPHAGOGASTRODUODENOSCOPY (EGD);  Surgeon: Shane Darling, MD;  Location: [facility] ENDOSCOPY;  Service: Endoscopy;  Laterality: N/A;   ESOPHAGOGASTRODUODENOSCOPY (EGD) WITH PROPOFOL  N/A 01/24/2021   Procedure: ESOPHAGOGASTRODUODENOSCOPY (EGD) WITH PROPOFOL ;  Surgeon: Shane Darling, MD;  Location: [facility] ENDOSCOPY;  Service: Endoscopy;  Laterality: N/A;   ESOPHAGOGASTRODUODENOSCOPY (EGD) WITH PROPOFOL  N/A 05/11/2023   Procedure: ESOPHAGOGASTRODUODENOSCOPY (EGD) WITH PROPOFOL ;  Surgeon: Shane Darling, MD;  Location: [facility] ENDOSCOPY;  Service: Endoscopy;  Laterality: N/A;   EYE SURGERY       Current Outpatient Medications  Medication Sig Dispense Refill   acetaminophen (TYLENOL) 325 MG tablet Take 650 mg by mouth every 6 (six) hours as needed for moderate pain or mild pain.     amLODipine (NORVASC) 5 MG tablet Take 5 mg by mouth daily.     Artificial Tear Solution (TEARS NATURALE OP) Apply to eye daily as needed (dry eyes).     aspirin EC 81 MG tablet Take 81 mg by mouth daily.  atorvastatin (LIPITOR) 10 MG tablet Take 10 mg by mouth daily.     gabapentin (NEURONTIN) 100 MG capsule Take 100 mg by mouth at bedtime.     metoprolol tartrate (LOPRESSOR) 25 MG tablet Take 50 mg by mouth 2 (two) times daily.     Multiple Vitamin (MULTIVITAMIN ADULT PO) Take by mouth daily.     vitamin E 180 MG (400 UNITS) capsule Take 400 Units by mouth daily.     losartan (COZAAR) 100 MG tablet Take 1 tablet (100 mg total) by mouth daily. 90 tablet 1   No  current facility-administered medications for this visit.    Allergies:   Zocor [simvastatin]    Social History:  The patient  reports that she has never smoked. She has never used smokeless tobacco. She reports that she does not drink alcohol and does not use drugs.   Family History:  The patient's family history includes Bone cancer in her father; Breast cancer (age of onset: 4) in her sister; Diabetes in her brother and sister; Heart attack in her brother and mother; Hyperlipidemia in her brother and sister; Stroke in her paternal grandmother.    ROS:  Please see the history of present illness.   Otherwise, review of systems are positive for none.   All other systems are reviewed and negative.    PHYSICAL EXAM: VS:  BP (!) 216/80   Pulse 64   Ht 5' (1.524 m)   Wt 134 lb 3.2 oz (60.9 kg)   SpO2 97%   BMI 26.21 kg/m  , BMI Body mass index is 26.21 kg/m. GEN: Well nourished, well developed, in no acute distress  HEENT: normal  Neck: no JVD, carotid bruits, or masses Cardiac: RRR; no murmurs, rubs, or gallops,no edema  Respiratory:  clear to auscultation bilaterally, normal work of breathing GI: soft, nontender, nondistended, + BS MS: no deformity or atrophy  Skin: warm and dry, no rash Neuro:  Strength and sensation are intact Psych: euthymic mood, full affect   EKG:  EKG is ordered today. The ekg ordered today demonstrates : Sinus bradycardia Marked ST abnormality, possible inferior subendocardial injury When compared with ECG of 04-Sep-2010 17:08, Premature ventricular complexes are no longer Present ST now depressed in Inferior leads T wave inversion now evident in Inferior leads T wave inversion now evident in Lateral leads    Recent Labs: No results found for requested labs within last 365 days.    Lipid Panel No results found for: "CHOL", "TRIG", "HDL", "CHOLHDL", "VLDL", "LDLCALC", "LDLDIRECT"    Wt Readings from Last 3 Encounters:  02/19/24 134 lb 3.2  oz (60.9 kg)  05/11/23 135 lb (61.2 kg)  01/24/21 125 lb (56.7 kg)           No data to display         I reviewed her previous cardiac records and also her previous cardiac catheterization images from 2009.  Results are summarized above.   ASSESSMENT AND PLAN:  1.  Coronary artery disease involving native coronary arteries without angina: Previous MI and stent placement in 2009 with no cardiac events since then.  Continue low-dose aspirin.  2.  Essential hypertension: Blood pressure is extremely elevated.  I elected to increase losartan to 100 mg once daily.  Continue metoprolol and we will consider switching to carvedilol if blood pressure remains elevated.  She is also on amlodipine 5 mg daily.  3.  Hyperlipidemia: I reviewed most recent lipid profile which showed an LDL of  44.  Continue atorvastatin.    Disposition:   FU with me in 6 months  Signed,  Antionette Kirks, MD  02/20/2024 12:52 PM     [facility]

## 2024-02-19 NOTE — Patient Instructions (Signed)
 Medication Instructions:  INCREASE the Losartan to 100 mg once daily  *If you need a refill on your cardiac medications before your next appointment, please call your pharmacy*  Lab Work: None ordered If you have labs (blood work) drawn today and your tests are completely normal, you will receive your results only by: MyChart Message (if you have MyChart) OR A paper copy in the mail If you have any lab test that is abnormal or we need to change your treatment, we will call you to review the results.  Testing/Procedures: None ordered  Follow-Up: At [facility], you and your health needs are our priority.  As part of our continuing mission to provide you with exceptional heart care, our providers are all part of one team.  This team includes your primary Cardiologist (physician) and Advanced Practice Providers or APPs (Physician Assistants and Nurse Practitioners) who all work together to provide you with the care you need, when you need it.  Your next appointment:   6 month(s)  Provider:   You may see Dr. Alvenia Aus or one of the following Advanced Practice Providers on your designated Care Team:   Laneta Pintos, NP Gildardo Labrador, PA-C Varney Gentleman, PA-C Cadence [place], PA-C Ronald Cockayne, NP Morey Ar, NP    We recommend signing up for the patient portal called "MyChart".  Sign up information is provided on this After Visit Summary.  MyChart is used to connect with patients for Virtual Visits (Telemedicine).  Patients are able to view lab/test results, encounter notes, upcoming appointments, etc.  Non-urgent messages can be sent to your provider as well.   To learn more about what you can do with MyChart, go to [email or web address].

## 2024-03-18 DIAGNOSIS — Z79899 Other long term (current) drug therapy: Secondary | ICD-10-CM | POA: Diagnosis not present

## 2024-03-18 DIAGNOSIS — R079 Chest pain, unspecified: Secondary | ICD-10-CM | POA: Diagnosis not present

## 2024-03-18 DIAGNOSIS — K449 Diaphragmatic hernia without obstruction or gangrene: Secondary | ICD-10-CM | POA: Diagnosis not present

## 2024-03-18 DIAGNOSIS — I517 Cardiomegaly: Secondary | ICD-10-CM | POA: Diagnosis not present

## 2024-03-18 DIAGNOSIS — R9431 Abnormal electrocardiogram [ECG] [EKG]: Secondary | ICD-10-CM | POA: Diagnosis not present

## 2024-03-18 DIAGNOSIS — I252 Old myocardial infarction: Secondary | ICD-10-CM | POA: Diagnosis not present

## 2024-03-18 DIAGNOSIS — R001 Bradycardia, unspecified: Secondary | ICD-10-CM | POA: Diagnosis not present

## 2024-03-18 DIAGNOSIS — I10 Essential (primary) hypertension: Secondary | ICD-10-CM | POA: Diagnosis not present

## 2024-03-19 DIAGNOSIS — Z09 Encounter for follow-up examination after completed treatment for conditions other than malignant neoplasm: Secondary | ICD-10-CM | POA: Diagnosis not present

## 2024-03-19 DIAGNOSIS — R42 Dizziness and giddiness: Secondary | ICD-10-CM | POA: Diagnosis not present

## 2024-03-19 DIAGNOSIS — I10 Essential (primary) hypertension: Secondary | ICD-10-CM | POA: Diagnosis not present

## 2024-03-20 DIAGNOSIS — I10 Essential (primary) hypertension: Secondary | ICD-10-CM | POA: Diagnosis not present

## 2024-05-27 DIAGNOSIS — R7303 Prediabetes: Secondary | ICD-10-CM | POA: Diagnosis not present

## 2024-05-27 DIAGNOSIS — Z79899 Other long term (current) drug therapy: Secondary | ICD-10-CM | POA: Diagnosis not present

## 2024-05-27 DIAGNOSIS — E785 Hyperlipidemia, unspecified: Secondary | ICD-10-CM | POA: Diagnosis not present

## 2024-05-27 DIAGNOSIS — Z0000 Encounter for general adult medical examination without abnormal findings: Secondary | ICD-10-CM | POA: Diagnosis not present

## 2024-05-27 DIAGNOSIS — M19049 Primary osteoarthritis, unspecified hand: Secondary | ICD-10-CM | POA: Diagnosis not present

## 2024-05-27 DIAGNOSIS — I10 Essential (primary) hypertension: Secondary | ICD-10-CM | POA: Diagnosis not present

## 2024-06-02 DIAGNOSIS — I10 Essential (primary) hypertension: Secondary | ICD-10-CM | POA: Diagnosis not present

## 2024-06-02 DIAGNOSIS — E785 Hyperlipidemia, unspecified: Secondary | ICD-10-CM | POA: Diagnosis not present

## 2024-06-02 DIAGNOSIS — M19049 Primary osteoarthritis, unspecified hand: Secondary | ICD-10-CM | POA: Diagnosis not present

## 2024-06-02 DIAGNOSIS — R7303 Prediabetes: Secondary | ICD-10-CM | POA: Diagnosis not present

## 2024-06-02 DIAGNOSIS — Z0000 Encounter for general adult medical examination without abnormal findings: Secondary | ICD-10-CM | POA: Diagnosis not present

## 2024-06-02 DIAGNOSIS — Z79899 Other long term (current) drug therapy: Secondary | ICD-10-CM | POA: Diagnosis not present

## 2024-08-19 ENCOUNTER — Encounter: Payer: Self-pay | Admitting: Physician Assistant

## 2024-08-19 ENCOUNTER — Ambulatory Visit: Attending: Physician Assistant | Admitting: Physician Assistant

## 2024-08-19 VITALS — BP 140/70 | HR 50 | Ht 60.0 in | Wt 129.8 lb

## 2024-08-19 DIAGNOSIS — R001 Bradycardia, unspecified: Secondary | ICD-10-CM

## 2024-08-19 DIAGNOSIS — E785 Hyperlipidemia, unspecified: Secondary | ICD-10-CM

## 2024-08-19 DIAGNOSIS — I10 Essential (primary) hypertension: Secondary | ICD-10-CM | POA: Diagnosis not present

## 2024-08-19 DIAGNOSIS — I2510 Atherosclerotic heart disease of native coronary artery without angina pectoris: Secondary | ICD-10-CM

## 2024-08-19 MED ORDER — LOSARTAN POTASSIUM 50 MG PO TABS
50.0000 mg | ORAL_TABLET | Freq: Every day | ORAL | 3 refills | Status: AC
Start: 2024-08-19 — End: ?

## 2024-08-19 MED ORDER — AMLODIPINE BESYLATE 5 MG PO TABS: 5.0000 mg | ORAL_TABLET | Freq: Every day | ORAL | 3 refills | Status: AC

## 2024-08-19 MED ORDER — AMLODIPINE BESYLATE 5 MG PO TABS: 5.0000 mg | ORAL_TABLET | Freq: Every day | ORAL | 0 refills | Status: DC

## 2024-08-19 MED ORDER — METOPROLOL TARTRATE 25 MG PO TABS: 25.0000 mg | ORAL_TABLET | Freq: Two times a day (BID) | ORAL | 3 refills | Status: AC

## 2024-08-19 NOTE — Patient Instructions (Signed)
 Medication Instructions:  Your physician recommends the following medication changes.  DECREASE: Lopressor 25 mg twice daily Losartan  50 mg daily  INCREASE: Amlodipine 5 mg daily   *If you need a refill on your cardiac medications before your next appointment, please call your pharmacy*  Lab Work: None ordered at this time   Testing/Procedures: Your physician has requested that you have an echocardiogram. Echocardiography is a painless test that uses sound waves to create images of your heart. It provides your doctor with information about the size and shape of your heart and how well your heart's chambers and valves are working.   You may receive an ultrasound enhancing agent through an IV if needed to better visualize your heart during the echo. This procedure takes approximately one hour.  There are no restrictions for this procedure.  This will take place at [address] [facility] [address], [place] [address]  Please note: We ask at that you not bring children with you during ultrasound (echo/ vascular) testing. Due to room size and safety concerns, children are not allowed in the ultrasound rooms during exams. Our front office staff cannot provide observation of children in our lobby area while testing is being conducted. An adult accompanying a patient to their appointment will only be allowed in the ultrasound room at the discretion of the ultrasound technician under special circumstances. We apologize for any inconvenience.  Follow-Up: At [facility], you and your health needs are our priority.  As part of our continuing mission to provide you with exceptional heart care, our providers are all part of one team.  This team includes your primary Cardiologist (physician) and Advanced Practice Providers or APPs (Physician Assistants and Nurse Practitioners) who all work together to provide you with the care you need, when you need it.  Your next appointment:    2 month(s)  Provider:   You may see Deatrice Cage, MD or Bernardino Bring, PA-C  We recommend signing up for the patient portal called MyChart.  Sign up information is provided on this After Visit Summary.  MyChart is used to connect with patients for Virtual Visits (Telemedicine).  Patients are able to view lab/test results, encounter notes, upcoming appointments, etc.  Non-urgent messages can be sent to your provider as well.   To learn more about what you can do with MyChart, go to [email or web address].

## 2024-08-19 NOTE — Progress Notes (Signed)
 Cardiology Office Note    Date:  08/19/2024   ID:  Lisa Gonzales, DOB October 30, 1934, MRN [id number]  PCP:  Auston Reyes BIRCH, MD  Cardiologist:  Deatrice Cage, MD  Electrophysiologist:  None   Chief Complaint: Follow-up  History of Present Illness:   Lisa Gonzales is a 89 y.o. female with history of CAD status post stenting in 2009, HTN, HLD, and prediabetes who presents for follow-up of CAD.  She was previously followed by [facility] cardiology, establishing care with Dr. Cage in 02/2024.  She underwent cardiac cath in 2009 that showed mild nonobstructive disease affecting the LAD.  The RCA had 99% proximal stenosis with faint left-to-right collaterals.  This was treated successfully with PCI/DES.  She has not had ischemic cardiac events since.  Upon establishing care with our office in 02/2024 she was doing well.  Blood pressure was elevated at triage at 216/80 with repeat of 192/80.  She reported adherence to pharmacotherapy and elevated BP readings at home.  Losartan  was increased to 100 mg daily with continuation of metoprolol and amlodipine (was not taking).  She was seen in the [facility] ED on 03/18/2024 with elevated blood pressure with a reading of 224/78.  High-sensitivity troponin negative.  She followed up with PCP on 03/19/2024 with BP of 160/80.  It was recommended that she start amlodipine 2.5 mg daily with continuation of losartan  50 mg daily and Lopressor 50 mg twice daily.  She comes in doing well from a cardiac perspective and is without symptoms of angina or cardiac decompensation.  Reviewing her medication log today, there are several discrepancies when compared to her last visit with us .  She reports that she is taking amlodipine 2.5 mg daily, indicating this was started on 03/19/2024 at PCPs office.  She reports that she was not taking amlodipine at her visit with us  in 02/2024.  She is also taking 50 mg of losartan  rather than the previously recommended 100 mg.  She is  taking 50 mg of metoprolol to tartrate twice daily.  She does notices some dizziness and generalized fatigue.  No lower extremity swelling or progressive orthopnea.  Blood pressure has been better controlled at home.   Labs independently reviewed: 05/2024 - Hgb 13.4, PLT 277, potassium 4.7, BUN 11, serum creatinine 0.7 albumin 4.2, AST/ALT normal, TC, TG 193, HDL 52, LDL 58, TSH normal, A1c 6.3  Past Medical History:  Diagnosis Date   Anemia    Arthritis    Cancer (HCC)    skin ca   Cataract cortical, senile    Coronary artery disease    Coronary atherosclerosis of native coronary artery    Diverticulosis    GERD (gastroesophageal reflux disease)    HOH (hard of hearing)    AIDS   Hyperlipidemia    Hypertension    Metacarpal bone fracture    Myocardial infarction [facility])    2009   Osteoarthritis    Osteoporosis    Sinoatrial node dysfunction [facility])     Past Surgical History:  Procedure Laterality Date   ABDOMINAL HYSTERECTOMY     CATARACT EXTRACTION W/PHACO Left 03/15/2017   Procedure: CATARACT EXTRACTION PHACO AND INTRAOCULAR LENS PLACEMENT (IOC);  Surgeon: Myrna Adine Anes, MD;  Location: [facility] ORS;  Service: Ophthalmology;  Laterality: Left;  US00:35.5 AP8.6 CDE3.06 FLUID LOT # [id number]   CATARACT EXTRACTION W/PHACO Right 05/10/2017   Procedure: CATARACT EXTRACTION PHACO AND INTRAOCULAR LENS PLACEMENT (IOC);  Surgeon: Myrna Adine Anes, MD;  Location: [facility] ORS;  Service: Ophthalmology;  Laterality: Right;  Lot # [id number] H US : 00:42.8 AP%: 9.1 CDE: 3.89   CORONARY ANGIOPLASTY     STENT   DILATION AND CURETTAGE OF UTERUS     ESOPHAGOGASTRODUODENOSCOPY N/A 11/19/2020   Procedure: ESOPHAGOGASTRODUODENOSCOPY (EGD);  Surgeon: Maryruth Ole DASEN, MD;  Location: [facility] ENDOSCOPY;  Service: Endoscopy;  Laterality: N/A;   ESOPHAGOGASTRODUODENOSCOPY (EGD) WITH PROPOFOL  N/A 01/24/2021   Procedure: ESOPHAGOGASTRODUODENOSCOPY (EGD) WITH PROPOFOL ;  Surgeon: Maryruth Ole DASEN, MD;   Location: [facility] ENDOSCOPY;  Service: Endoscopy;  Laterality: N/A;   ESOPHAGOGASTRODUODENOSCOPY (EGD) WITH PROPOFOL  N/A 05/11/2023   Procedure: ESOPHAGOGASTRODUODENOSCOPY (EGD) WITH PROPOFOL ;  Surgeon: Maryruth Ole DASEN, MD;  Location: [facility] ENDOSCOPY;  Service: Endoscopy;  Laterality: N/A;   EYE SURGERY      Current Medications: Current Meds  Medication Sig   acetaminophen (TYLENOL) 325 MG tablet Take 650 mg by mouth every 6 (six) hours as needed for moderate pain or mild pain.   amLODipine (NORVASC) 5 MG tablet Take 1 tablet (5 mg total) by mouth daily.   Artificial Tear Solution (TEARS NATURALE OP) Apply to eye daily as needed (dry eyes).   aspirin EC 81 MG tablet Take 81 mg by mouth daily.   atorvastatin (LIPITOR) 10 MG tablet Take 10 mg by mouth daily.   Multiple Vitamin (MULTIVITAMIN ADULT PO) Take by mouth daily.   vitamin E 180 MG (400 UNITS) capsule Take 400 Units by mouth daily.   [DISCONTINUED] amLODipine (NORVASC) 5 MG tablet Take 5 mg by mouth daily.   [DISCONTINUED] losartan  (COZAAR ) 100 MG tablet Take 1 tablet (100 mg total) by mouth daily.   [DISCONTINUED] metoprolol tartrate (LOPRESSOR) 25 MG tablet Take 50 mg by mouth 2 (two) times daily.    Allergies:   Zocor [simvastatin]   Social History   Socioeconomic History   Marital status: Married    Spouse name: Not on file   Number of children: Not on file   Years of education: Not on file   Highest education level: Not on file  Occupational History   Not on file  Tobacco Use   Smoking status: Never   Smokeless tobacco: Never  Vaping Use   Vaping status: Never Used  Substance and Sexual Activity   Alcohol use: No   Drug use: No   Sexual activity: Not on file  Other Topics Concern   Not on file  Social History Narrative   Not on file   Social Drivers of Health   Financial Resource Strain: Low Risk  (11/26/2023)   Received from [facility]   Overall Financial Resource Strain (CARDIA)     Difficulty of Paying Living Expenses: Not hard at all  Food Insecurity: No Food Insecurity (11/26/2023)   [facility] from [facility]   Hunger Vital Sign    Within the past 12 months, you worried that your food would run out before you got the money to buy more.: Never true    Within the past 12 months, the food you bought just didn't last and you didn't have money to get more.: Never true  Transportation Needs: No Transportation Needs (11/26/2023)   Received from [facility]    In the past 12 months, has lack of transportation kept you from medical appointments or from getting medications?: No    Lack of Transportation (Non-Medical): No  Physical Activity: Not on file  Stress: Not on file  Social Connections: Not on file  Family History:  The patient's family history includes Bone cancer in her father; Breast cancer (age of onset: 3) in her sister; Diabetes in her brother and sister; Heart attack in her brother and mother; Hyperlipidemia in her brother and sister; Stroke in her paternal grandmother.  ROS:   12-point review of systems is negative unless otherwise noted in the HPI.   EKGs/Labs/Other Studies Reviewed:    Studies reviewed were summarized above. The additional studies were reviewed today: As above.    EKG:  EKG is ordered today.  The EKG ordered today demonstrates sinus bradycardia, 50 bpm, LVH, anterior ST depression, anterolateral T wave inversion  Recent Labs: No results found for requested labs within last 365 days.  Recent Lipid Panel No results found for: CHOL, TRIG, HDL, CHOLHDL, VLDL, LDLCALC, LDLDIRECT  PHYSICAL EXAM:    VS:  BP (!) 140/70 (BP Location: Left Arm, Patient Position: Sitting, Cuff Size: Normal)   Pulse (!) 50 Comment: 55 oximeter  Ht 5' (1.524 m)   Wt 129 lb 12.8 oz (58.9 kg)   SpO2 97%   BMI 25.35 kg/m   BMI: Body mass index is 25.35 kg/m.  Physical  Exam Vitals reviewed.  Constitutional:      Appearance: She is well-developed.  HENT:     Head: Normocephalic and atraumatic.  Eyes:     General:        Right eye: No discharge.        Left eye: No discharge.  Cardiovascular:     Rate and Rhythm: Regular rhythm. Bradycardia present.     Heart sounds: Normal heart sounds, S1 normal and S2 normal. Heart sounds not distant. No midsystolic click and no opening snap. No murmur heard.    No friction rub.  Pulmonary:     Effort: Pulmonary effort is normal. No respiratory distress.     Breath sounds: Normal breath sounds. No decreased breath sounds, wheezing, rhonchi or rales.  Musculoskeletal:     Cervical back: Normal range of motion.     Right lower leg: No edema.     Left lower leg: No edema.  Skin:    General: Skin is warm and dry.     Nails: There is no clubbing.  Neurological:     Mental Status: She is alert and oriented to person, place, and time.  Psychiatric:        Speech: Speech normal.        Behavior: Behavior normal.        Thought Content: Thought content normal.        Judgment: Judgment normal.     Wt Readings from Last 3 Encounters:  08/19/24 129 lb 12.8 oz (58.9 kg)  02/19/24 134 lb 3.2 oz (60.9 kg)  05/11/23 135 lb (61.2 kg)     ASSESSMENT & PLAN:   CAD involving native coronary arteries without angina: She is doing well and without symptoms concerning for angina or cardiac decompensation.  Continue aggressive risk factor modification and secondary prevention including aspirin 81 mg and atorvastatin 10 mg.  Obtain echo as outlined below.  HTN: Blood pressure is improved, though mildly elevated in the office today.  Query if her underlying bradycardic rates are contributing to dizziness and fatigue.  In this setting we will reduce Lopressor to 25 mg twice daily.  To compensate for this we will increase amlodipine to 5 mg daily.  She remains on losartan  50 mg.  Obtain echo.  HLD: LDL 58 in 05/2024 with normal  AST/ALT at that time.  Remains on atorvastatin 10 mg.  Bradycardia: Reduce Lopressor as outlined above.  No indication for PPM at this time.     Disposition: F/u with Dr. Darron or an APP in 2 months.   Medication Adjustments/Labs and Tests Ordered: Current medicines are reviewed at length with the patient today.  Concerns regarding medicines are outlined above. Medication changes, Labs and Tests ordered today are summarized above and listed in the Patient Instructions accessible in Encounters.   Signed, Bernardino Bring, PA-C 08/19/2024 1:29 PM     [facility] [place] [address] [place], [place] [address] [phone]

## 2024-10-14 ENCOUNTER — Ambulatory Visit

## 2024-10-14 ENCOUNTER — Ambulatory Visit: Payer: Self-pay | Admitting: Student

## 2024-10-14 DIAGNOSIS — I10 Essential (primary) hypertension: Secondary | ICD-10-CM

## 2024-10-14 DIAGNOSIS — I2510 Atherosclerotic heart disease of native coronary artery without angina pectoris: Secondary | ICD-10-CM

## 2024-10-14 LAB — ECHOCARDIOGRAM COMPLETE
AR max vel: 1.75 cm2
AV Area VTI: 1.94 cm2
AV Area mean vel: 1.67 cm2
AV Mean grad: 5 mmHg
AV Peak grad: 8.3 mmHg
Ao pk vel: 1.44 m/s
Area-P 1/2: 2.95 cm2
MV VTI: 1.31 cm2
S' Lateral: 2.5 cm

## 2024-10-28 ENCOUNTER — Ambulatory Visit: Admitting: Physician Assistant

## 2024-11-06 ENCOUNTER — Ambulatory Visit

## 2024-11-06 DIAGNOSIS — L219 Seborrheic dermatitis, unspecified: Secondary | ICD-10-CM | POA: Diagnosis not present

## 2024-11-06 DIAGNOSIS — L299 Pruritus, unspecified: Secondary | ICD-10-CM | POA: Diagnosis not present

## 2024-11-06 MED ORDER — CLOBETASOL PROPIONATE 0.05 % EX SOLN: CUTANEOUS | 5 refills | Status: AC

## 2024-11-06 MED ORDER — KETOCONAZOLE 2 % EX SHAM: MEDICATED_SHAMPOO | CUTANEOUS | 5 refills | Status: AC

## 2024-11-06 NOTE — Patient Instructions (Signed)

## 2024-11-06 NOTE — Progress Notes (Signed)
" °  °  Subjective   Kaleea Huffines Gonzales is a 89 y.o. female who presents for the following: scalp concersns. Patient is new patient  Today patient reports: Dry, itchy scalp x 6 months has been using selsun blue shampoo OTC and was prescribed selsun 2.5% lotion by her PCP with no help.   Review of Systems:    No other skin or systemic complaints except as noted in HPI or Assessment and Plan.  The following portions of the chart were reviewed this encounter and updated as appropriate: medications, allergies, medical history  Relevant Medical History:  n/a   Objective  (SKPE) Well appearing patient in no apparent distress; mood and affect are within normal limits. Examination was performed of the: Focused Exam of: Scalp   Examination notable for: Seborrheic Dermatitis: Erythema and scaling of the scalp, ear canals, and central face > rest of face.  Examination limited by: Undergarments, Shoes or socks , and Clothing     Assessment & Plan  (SKAP)   Seborrheic dermatitis w/ scalp pruritus  Chronic and persistent condition with duration or expected duration over one year. Condition is symptomatic and bothersome to patient. Patient is flaring and not currently at treatment goal.  - Discussed diagnosis, typical course, and treatment options for this condition - Explained to the patient the chronic nature of this diagnosis - - Start clobetasol  solution 0.05% twice daily to affected skin - Discussed side effect of super potent topical steroids including atrophy, dyspigmentation, striae, telangectasia, folliculitis, loss of skin pigment, hair growth, tachyphylaxis, risk of systemic absorption with missuse.  - Start ketoconazole  2% shampoo TIW, apply to scalp and affected areas of face/body and allow the shampoo to sit for 5-10 minutes before rinsing off - Consider alternating with use of Head and Shoulders or Selsun Blue anti-dandruff shampoo which contains zinc pyrithione 1%   Was sun  protection counseling provided?: No   Level of service outlined above   Patient instructions (SKPI)   Procedures, orders, diagnosis for this visit:    There are no diagnoses linked to this encounter.  Return to clinic: Return if symptoms worsen or fail to improve, for w/ Dr. Raymund.  Lisa Gonzales, CMA, am acting as scribe for Lisa JAYSON Raymund, MD.  Documentation: I have reviewed the above documentation for accuracy and completeness, and I agree with the above.  Lisa JAYSON Raymund, MD  "

## 2024-11-11 NOTE — Progress Notes (Unsigned)
 "  Cardiology Office Note    Date:  11/11/2024   ID:  Lisa Gonzales, DOB 03-Mar-1935, MRN [id number]  PCP:  Auston Reyes BIRCH, MD  Cardiologist:  Deatrice Cage, MD  Electrophysiologist:  None   Chief Complaint: Follow up  History of Present Illness:   Lisa Car Choyce is a 89 y.o. female with history of CAD status post stenting in 2009, HTN, HLD, and prediabetes who presents for follow-up of HTN and echo.  She was previously followed by [facility] cardiology, establishing care with Dr. Cage in 02/2024.  She underwent cardiac cath in 2009 that showed mild nonobstructive disease affecting the LAD.  The RCA had 99% proximal stenosis with faint left-to-right collaterals.  This was treated successfully with PCI/DES.  She has not had ischemic cardiac events since.  Upon establishing care with our office in 02/2024 she was doing well.  Blood pressure was elevated at triage at 216/80 with repeat of 192/80.  She reported adherence to pharmacotherapy and elevated BP readings at home.  Losartan  was increased to 100 mg daily with continuation of metoprolol  and amlodipine  (was not taking).   She was seen in the [facility] ED on 03/18/2024 with elevated blood pressure with a reading of 224/78.  High-sensitivity troponin negative.  She followed up with PCP on 03/19/2024 with BP of 160/80.  It was recommended she start amlodipine  2.5 mg daily with continuation of losartan  50 mg daily and Lopressor  50 mg twice daily.  She was most recently seen in our office in 08/2024 and was doing well from a cardiac perspective.  Medication regimen at that time included Lopressor  50 mg twice daily, losartan  50 mg, and amlodipine  2.5 mg.  She did report some dizziness and fatigue leading to Lopressor  to be reduced to 25 mg twice daily with recommendation to uptitrate amlodipine  to 5 mg daily and continuation of losartan  50 mg daily.  Echo in 09/2024 showed an EF of 60 to 65%, no regional wall motion abnormalities, mild LVH,  grade 2 diastolic dysfunction, normal RV systolic function and ventricular cavity size, mild to moderate mitral regurgitation with moderate mitral annular calcification, and normal CVP.  ***   Labs independently reviewed: 05/2024 - Hgb 13.4, PLT 277, potassium 4.7, BUN 11, serum creatinine 0.7 albumin 4.2, AST/ALT normal, TC, TG 193, HDL 52, LDL 58, TSH normal, A1c 6.3   Past Medical History:  Diagnosis Date   Anemia    Arthritis    Cancer (HCC)    skin ca   Cataract cortical, senile    Coronary artery disease    Coronary atherosclerosis of native coronary artery    Diverticulosis    GERD (gastroesophageal reflux disease)    HOH (hard of hearing)    AIDS   Hyperlipidemia    Hypertension    Metacarpal bone fracture    Myocardial infarction [facility])    2009   Osteoarthritis    Osteoporosis    Sinoatrial node dysfunction [facility])     Past Surgical History:  Procedure Laterality Date   ABDOMINAL HYSTERECTOMY     CATARACT EXTRACTION W/PHACO Left 03/15/2017   Procedure: CATARACT EXTRACTION PHACO AND INTRAOCULAR LENS PLACEMENT (IOC);  Surgeon: Myrna Adine Anes, MD;  Location: [facility] ORS;  Service: Ophthalmology;  Laterality: Left;  US00:35.5 AP8.6 CDE3.06 FLUID LOT # [id number]   CATARACT EXTRACTION W/PHACO Right 05/10/2017   Procedure: CATARACT EXTRACTION PHACO AND INTRAOCULAR LENS PLACEMENT (IOC);  Surgeon: Myrna Adine Anes, MD;  Location: [facility] ORS;  Service: Ophthalmology;  Laterality:  Right;  Lot # [id number] H US : 00:42.8 AP%: 9.1 CDE: 3.89   CORONARY ANGIOPLASTY     STENT   DILATION AND CURETTAGE OF UTERUS     ESOPHAGOGASTRODUODENOSCOPY N/A 11/19/2020   Procedure: ESOPHAGOGASTRODUODENOSCOPY (EGD);  Surgeon: Maryruth Ole DASEN, MD;  Location: [facility] ENDOSCOPY;  Service: Endoscopy;  Laterality: N/A;   ESOPHAGOGASTRODUODENOSCOPY (EGD) WITH PROPOFOL  N/A 01/24/2021   Procedure: ESOPHAGOGASTRODUODENOSCOPY (EGD) WITH PROPOFOL ;  Surgeon: Maryruth Ole DASEN, MD;  Location: [facility] ENDOSCOPY;   Service: Endoscopy;  Laterality: N/A;   ESOPHAGOGASTRODUODENOSCOPY (EGD) WITH PROPOFOL  N/A 05/11/2023   Procedure: ESOPHAGOGASTRODUODENOSCOPY (EGD) WITH PROPOFOL ;  Surgeon: Maryruth Ole DASEN, MD;  Location: [facility] ENDOSCOPY;  Service: Endoscopy;  Laterality: N/A;   EYE SURGERY      Current Medications: Active Medications[1]  Allergies:   Zocor [simvastatin]   Social History   Socioeconomic History   Marital status: Married    Spouse name: Not on file   Number of children: Not on file   Years of education: Not on file   Highest education level: Not on file  Occupational History   Not on file  Tobacco Use   Smoking status: Never   Smokeless tobacco: Never  Vaping Use   Vaping status: Never Used  Substance and Sexual Activity   Alcohol use: No   Drug use: No   Sexual activity: Not on file  Other Topics Concern   Not on file  Social History Narrative   Not on file   Social Drivers of Health   Tobacco Use: Low Risk (08/19/2024)   Patient History    Smoking Tobacco Use: Never    Smokeless Tobacco Use: Never    Passive Exposure: Not on file  Financial Resource Strain: Low Risk  (11/26/2023)   Received [facility]   Overall Financial Resource Strain (CARDIA)    Difficulty of Paying Living Expenses: Not hard at all  Food Insecurity: No Food Insecurity (11/26/2023)   Received from [facility]   Epic    Within the past 12 months, you worried that your food would run out before you got the money to buy more.: Never true    Within the past 12 months, the food you bought just didn't last and you didn't have money to get more.: Never true  Transportation Needs: No Transportation Needs (11/26/2023)   Received from [facility] - Transportation    In the past 12 months, has lack of transportation kept you from medical appointments or from getting medications?: No    Lack of Transportation (Non-Medical): No  Physical  Activity: Not on file  Stress: Not on file  Social Connections: Not on file  Depression (EYV7-0): Not on file  Alcohol Screen: Not on file  Housing: Unknown (03/19/2024)   Received from [facility]   Epic    In the last 12 months, was there a time when you were not able to pay the mortgage or rent on time?: No    Number of Times Moved in the Last Year: Not on file    At any time in the past 12 months, were you homeless or living in a shelter (including now)?: No  Utilities: Not At Risk (11/26/2023)   Received from [facility] Utilities    Threatened with loss of utilities: No  Health Literacy: Not on file     Family History:  The patient's family history includes Bone cancer in  her father; Breast cancer (age of onset: 68) in her sister; Diabetes in her brother and sister; Heart attack in her brother and mother; Hyperlipidemia in her brother and sister; Stroke in her paternal grandmother.  ROS:   12-point review of systems is negative unless otherwise noted in the HPI.   EKGs/Labs/Other Studies Reviewed:    Studies reviewed were summarized above. The additional studies were reviewed today:   2D echo 10/14/2024:  1. Left ventricular ejection fraction, by estimation, is 60 to 65%. Left  ventricular ejection fraction by 3D volume is 59 %. The left ventricle has  normal function. The left ventricle has no regional wall motion  abnormalities. There is mild left  ventricular hypertrophy. Left ventricular diastolic parameters are  consistent with Grade II diastolic dysfunction (pseudonormalization). The  average left ventricular global longitudinal strain is -15.6 %. The global  longitudinal strain is abnormal.   2. Right ventricular systolic function is normal. The right ventricular  size is normal.   3. Left atrial size was mildly dilated.   4. The mitral valve is normal in structure. Mild to moderate mitral valve  regurgitation. No evidence  of mitral stenosis. Moderate mitral annular  calcification.   5. The aortic valve is normal in structure. Aortic valve regurgitation is  not visualized. No aortic stenosis is present.   6. The inferior vena cava is normal in size with greater than 50%  respiratory variability, suggesting right atrial pressure of 3 mmHg.    EKG:  EKG is ordered today.  The EKG ordered today demonstrates ***  Recent Labs: No results found for requested labs within last 365 days.  Recent Lipid Panel No results found for: CHOL, TRIG, HDL, CHOLHDL, VLDL, LDLCALC, LDLDIRECT  PHYSICAL EXAM:    VS:  There were no vitals taken for this visit.  BMI: There is no height or weight on file to calculate BMI.  Physical Exam  Wt Readings from Last 3 Encounters:  08/19/24 129 lb 12.8 oz (58.9 kg)  02/19/24 134 lb 3.2 oz (60.9 kg)  05/11/23 135 lb (61.2 kg)     ASSESSMENT & PLAN:   CAD involving native coronary arteries without angina:   HTN: Blood pressure  HLD: LDL 58 in 05/2024.   Bradycardia:   {Are you ordering a CV Procedure (e.g. stress test, cath, DCCV, TEE, etc)?   Press F2        :[id number]}     Disposition: F/u with Dr. Darron or an APP in ***.   Medication Adjustments/Labs and Tests Ordered: Current medicines are reviewed at length with the patient today.  Concerns regarding medicines are outlined above. Medication changes, Labs and Tests ordered today are summarized above and listed in the Patient Instructions accessible in Encounters.   SignedBernardino Bring, PA-C 11/11/2024 5:41 PM     [facility] [place] [address] [place], [place] [address] [phone]     [1]  No outpatient medications have been marked as taking for the 11/12/24 encounter (Appointment) with Bring Bernardino HERO, PA-C.   "

## 2024-11-12 ENCOUNTER — Ambulatory Visit: Admitting: Physician Assistant

## 2024-11-12 DIAGNOSIS — R001 Bradycardia, unspecified: Secondary | ICD-10-CM

## 2024-11-12 DIAGNOSIS — E785 Hyperlipidemia, unspecified: Secondary | ICD-10-CM

## 2024-11-12 DIAGNOSIS — I10 Essential (primary) hypertension: Secondary | ICD-10-CM

## 2024-11-12 DIAGNOSIS — I2510 Atherosclerotic heart disease of native coronary artery without angina pectoris: Secondary | ICD-10-CM

## 2024-11-19 NOTE — Progress Notes (Unsigned)
 " Cardiology Office Note   Date: 11/20/2024  ID:  Lisa Gonzales 10-08-1935 [id number] PCP: Auston Reyes BIRCH, MD  [facility] Providers Cardiologist: Deatrice Cage, MD     Chief Complaint: Lisa Gonzales is a 89 y.o.female with PMH of CAD s/p MI with DES to RCA in 2009, hypertension, mild to moderate MR on echo 09/2024, diastolic dysfunction, hyperlipidemia, prediabetes who presents to the clinic for two-month follow-up.    Lisa Gonzales previously followed with [facility].  She established care with Dr. Cage on 02/2024.  She has a history of an MI in 2009.  Cardiac catheterization revealed mild nonobstructive disease in the LAD but 99% stenosis of the RCA with faint collaterals.  This was successfully treated with PCI/DES. She has had no further cardiac events.  She was noted to be significantly hypertensive during her first visit with [facility], and she noted that her home readings had recently been elevated.  Losartan  was increased, metoprolol  and amlodipine  continued. Seen in the ER 03/2024 with significant hypertension.  Troponins were negative. Discharged with cardiology follow-up. Seen by PCP 03/2024 with elevated BP. Amlodipine  restarted, losartan  and metoprolol  tartrate continued.  Seen in the clinic again 08/2024, doing okay. She noted dizziness and fatigue and was bradycardic with HR 50 bpm.After medication reconciliation, metoprolol  was decreased and amlodipine  was increased. Losartan  was continued. An echocardiogram was recommended.  This was completed 10/14/2024 showing LVEF 60-65% with no RWMAs, mild LVH, G2DD, mild to moderate MR.    History of Present Illness: Today she is doing remarkably well.  She was able to get on a ladder, clean windows, and hang curtains yesterday without dyspnea or chest pain.  Dizziness and fatigue much improved.  BPs 100s-110s/70s-80s on home readings.  She eats relatively healthy, noting lots of fruits and vegetables with limited meat  intake.  She tells me that her husband is also being seen in the clinic today.  ROS: Denies chest pain, shortness of breath, lower extremity edema, palpitations, syncope.   Studies Reviewed: The following studies were reviewed today: Cardiac Studies & Procedures   ______________________________________________________________________________________________     ECHOCARDIOGRAM  ECHOCARDIOGRAM COMPLETE 10/14/2024  Narrative ECHOCARDIOGRAM REPORT    Patient Name:   Lisa Gonzales Date of Exam: 10/14/2024 Medical Rec #:  [id number]           Height:       60.0 in Accession #:    [id number]          Weight:       129.8 lb Date of Birth:  May 12, 1935           BSA:          1.553 m Patient Age:    89 years            BP:           140/70 mmHg Patient Gender: F                   HR:           63 bpm. Exam Location:    Procedure: 2D Echo, 3D Echo, Cardiac Doppler, Color Doppler and Strain Analysis (Both Spectral and Color Flow Doppler were utilized during procedure).  Indications:    I25.10 coronary artery disease  History:        Patient has no prior history of Echocardiogram examinations. CAD and Previous Myocardial Infarction; Risk Factors:Non-Smoker, Hypertension and Dyslipidemia.  Sonographer:    Gordon Miyamoto Referring Phys:  [id number] RYAN M DUNN   Sonographer Comments: moderate MAC, moderate PR, LVH IMPRESSIONS   1. Left ventricular ejection fraction, by estimation, is 60 to 65%. Left ventricular ejection fraction by 3D volume is 59 %. The left ventricle has normal function. The left ventricle has no regional wall motion abnormalities. There is mild left ventricular hypertrophy. Left ventricular diastolic parameters are consistent with Grade II diastolic dysfunction (pseudonormalization). The average left ventricular global longitudinal strain is -15.6 %. The global longitudinal strain is abnormal. 2. Right ventricular systolic function is normal. The right  ventricular size is normal. 3. Left atrial size was mildly dilated. 4. The mitral valve is normal in structure. Mild to moderate mitral valve regurgitation. No evidence of mitral stenosis. Moderate mitral annular calcification. 5. The aortic valve is normal in structure. Aortic valve regurgitation is not visualized. No aortic stenosis is present. 6. The inferior vena cava is normal in size with greater than 50% respiratory variability, suggesting right atrial pressure of 3 mmHg.  FINDINGS Left Ventricle: Left ventricular ejection fraction, by estimation, is 60 to 65%. Left ventricular ejection fraction by 3D volume is 59 %. The left ventricle has normal function. The left ventricle has no regional wall motion abnormalities. The average left ventricular global longitudinal strain is -15.6 %. Strain was performed and the global longitudinal strain is abnormal. The left ventricular internal cavity size was normal in size. There is mild left ventricular hypertrophy. Left ventricular diastolic parameters are consistent with Grade II diastolic dysfunction (pseudonormalization).  Right Ventricle: The right ventricular size is normal. No increase in right ventricular wall thickness. Right ventricular systolic function is normal.  Left Atrium: Left atrial size was mildly dilated.  Right Atrium: Right atrial size was normal in size.  Pericardium: There is no evidence of pericardial effusion.  Mitral Valve: The mitral valve is normal in structure. Moderate mitral annular calcification. Mild to moderate mitral valve regurgitation. No evidence of mitral valve stenosis. MV peak gradient, 6.9 mmHg. The mean mitral valve gradient is 3.0 mmHg.  Tricuspid Valve: The tricuspid valve is normal in structure. Tricuspid valve regurgitation is not demonstrated. No evidence of tricuspid stenosis.  Aortic Valve: The aortic valve is normal in structure. Aortic valve regurgitation is not visualized. No aortic stenosis is  present. Aortic valve mean gradient measures 5.0 mmHg. Aortic valve peak gradient measures 8.3 mmHg. Aortic valve area, by VTI measures 1.94 cm.  Pulmonic Valve: The pulmonic valve was normal in structure. Pulmonic valve regurgitation is mild. No evidence of pulmonic stenosis.  Aorta: The aortic root is normal in size and structure.  Venous: The inferior vena cava is normal in size with greater than 50% respiratory variability, suggesting right atrial pressure of 3 mmHg.  IAS/Shunts: No atrial level shunt detected by color flow Doppler.  Additional Comments: 3D was performed not requiring image post processing on an independent workstation and was normal.   LEFT VENTRICLE PLAX 2D LVIDd:         4.50 cm         Diastology LVIDs:         2.50 cm         LV e' medial:    4.79 cm/s LV PW:         1.20 cm         LV E/e' medial:  29.0 LV IVS:        1.20 cm         LV e' lateral:   7.72 cm/s LVOT  diam:     1.60 cm         LV E/e' lateral: 18.0 LV SV:         65 LV SV Index:   42              2D Longitudinal LVOT Area:     2.01 cm        Strain 2D Strain GLS   -15.6 % Avg:  3D Volume EF LV 3D EF:    Left ventricul ar ejection fraction by 3D volume is 59 %.  3D Volume EF: 3D EF:        59 % LV EDV:       72 ml LV ESV:       30 ml LV SV:        43 ml  RIGHT VENTRICLE RV Basal diam:  2.40 cm RV Mid diam:    1.80 cm RV S prime:     12.20 cm/s TAPSE (M-mode): 2.5 cm  LEFT ATRIUM             Index        RIGHT ATRIUM           Index LA diam:        4.30 cm 2.77 cm/m   RA Area:     11.90 cm LA Vol (A2C):   54.4 ml 35.02 ml/m  RA Volume:   24.70 ml  15.90 ml/m LA Vol (A4C):   72.0 ml 46.35 ml/m LA Biplane Vol: 61.8 ml 39.79 ml/m AORTIC VALVE AV Area (Vmax):    1.75 cm AV Area (Vmean):   1.67 cm AV Area (VTI):     1.94 cm AV Vmax:           144.00 cm/s AV Vmean:          98.400 cm/s AV VTI:            0.337 m AV Peak Grad:      8.3 mmHg AV Mean Grad:      5.0  mmHg LVOT Vmax:         125.00 cm/s LVOT Vmean:        81.900 cm/s LVOT VTI:          0.325 m LVOT/AV VTI ratio: 0.96  AORTA Ao Sinus diam: 2.40 cm Ao Asc diam:   2.80 cm  MITRAL VALVE MV Area (PHT): 2.95 cm     SHUNTS MV Area VTI:   1.31 cm     Systemic VTI:  0.32 m MV Peak grad:  6.9 mmHg     Systemic Diam: 1.60 cm MV Mean grad:  3.0 mmHg MV Vmax:       1.31 m/s MV Vmean:      78.6 cm/s MV Decel Time: 257 msec MV E velocity: 139.00 cm/s MV A velocity: 109.00 cm/s MV E/A ratio:  1.28  Evalene Lunger MD Electronically signed by Evalene Lunger MD Signature Date/Time: 10/14/2024/1:21:38 PM    Final          ______________________________________________________________________________________________                        Physical Exam: VS: BP 108/78 (BP Location: Left Arm, Patient Position: Sitting, Cuff Size: Normal)   Pulse (!) 53   Ht 5' (1.524 m)   Wt 130 lb (59 kg)   SpO2 98%   BMI 25.39 kg/m    GEN: Well nourished, in NAD HEENT: Normal  NECK: No JVD CARDIAC: Bradycardic, RRR, no murmurs RESPIRATORY: Diminished bilaterally ABDOMEN: Soft, non-tender, non-distended MUSCULOSKELETAL: No edema SKIN: Warm and dry NEUROLOGIC:  Alert and oriented x 3 PSYCHIATRIC:  Normal affect   Assessment & Plan: 1. Hypertension: BP today 108/78, similar to home readings.  She is pleased with her current medication regimen. - Continue amlodipine  5 mg daily - Continue losartan  50 mg daily - check CMP today - Continue metoprolol  tartrate 25 mg twice daily  2. CAD: Has history of MI in 2009 with DES to RCA. Has not had another cardiac event since then.  She is very active for her age, got on a ladder and hung curtains yesterday without anginal symptoms.  No further ischemic evaluation recommended at this time. - Continue aspirin 81 mg daily - Continue metoprolol  tartrate 25 mg twice daily - Continue atorvastatin 10 mg daily - check CMP today  3. Hyperlipidemia:  05/27/2024 LDL 58, HDL 52, TGs 193, total 149. Labs followed by PCP. LDL well-controlled on current regimen. - Continue atorvastatin 10 mg daily - check CMP today  4. Diastolic dysfunction: Echo 09/2024 noted G2DD.  She is without symptoms of dyspnea or peripheral edema. - Recommend repeating echocardiogram if she becomes symptomatic  5. Mitral regurgitation: Mild to moderate MR noted on echo 09/2024.  She is asymptomatic. - Recommend repeating echocardiogram if she becomes symptomatic  6. Bradycardia: Pulse 53 today.  EKG not obtained today, but she has been bradycardic on review of past EKGs.  She denies further dizziness or fatigue.  Dispo: Follow-up in 6 months with Dr. Darron or Bernardino Bring, PA or sooner if needed.  Signed, Saddie GORMAN Cleaves, NP 11/20/2024 8:50 AM [facility] "

## 2024-11-20 ENCOUNTER — Ambulatory Visit

## 2024-11-20 VITALS — BP 108/78 | HR 53 | Ht 60.0 in | Wt 130.0 lb

## 2024-11-20 DIAGNOSIS — I5189 Other ill-defined heart diseases: Secondary | ICD-10-CM | POA: Diagnosis not present

## 2024-11-20 DIAGNOSIS — I10 Essential (primary) hypertension: Secondary | ICD-10-CM | POA: Diagnosis not present

## 2024-11-20 DIAGNOSIS — I2510 Atherosclerotic heart disease of native coronary artery without angina pectoris: Secondary | ICD-10-CM | POA: Diagnosis not present

## 2024-11-20 DIAGNOSIS — E782 Mixed hyperlipidemia: Secondary | ICD-10-CM | POA: Diagnosis not present

## 2024-11-20 DIAGNOSIS — R001 Bradycardia, unspecified: Secondary | ICD-10-CM | POA: Diagnosis not present

## 2024-11-20 NOTE — Patient Instructions (Signed)
 Medication Instructions:  Your physician recommends that you continue on your current medications as directed. Please refer to the Current Medication list given to you today.   *If you need a refill on your cardiac medications before your next appointment, please call your pharmacy*  Lab Work: Your provider would like for you to have following labs drawn today CMP.   If you have labs (blood work) drawn today and your tests are completely normal, you will receive your results only by: MyChart Message (if you have MyChart) OR A paper copy in the mail If you have any lab test that is abnormal or we need to change your treatment, we will call you to review the results.  Testing/Procedures: None ordered at this time   Follow-Up: At [facility], you and your health needs are our priority.  As part of our continuing mission to provide you with exceptional heart care, our providers are all part of one team.  This team includes your primary Cardiologist (physician) and Advanced Practice Providers or APPs (Physician Assistants and Nurse Practitioners) who all work together to provide you with the care you need, when you need it.  Your next appointment:   6 month(s)  Provider:   You may see Deatrice Cage, MD or Bernardino Bring, PA-C  We recommend signing up for the patient portal called MyChart.  Sign up information is provided on this After Visit Summary.  MyChart is used to connect with patients for Virtual Visits (Telemedicine).  Patients are able to view lab/test results, encounter notes, upcoming appointments, etc.  Non-urgent messages can be sent to your provider as well.   To learn more about what you can do with MyChart, go to [email or web address].

## 2024-11-21 ENCOUNTER — Ambulatory Visit: Payer: Self-pay

## 2024-11-21 LAB — COMPREHENSIVE METABOLIC PANEL WITH GFR
ALT: 15 [IU]/L (ref 0–32)
AST: 23 [IU]/L (ref 0–40)
Albumin: 4.4 g/dL (ref 3.7–4.7)
Alkaline Phosphatase: 78 [IU]/L (ref 48–129)
BUN/Creatinine Ratio: 13 (ref 12–28)
BUN: 10 mg/dL (ref 8–27)
Bilirubin Total: 0.5 mg/dL (ref 0.0–1.2)
CO2: 22 mmol/L (ref 20–29)
Calcium: 9.1 mg/dL (ref 8.7–10.3)
Chloride: 103 mmol/L (ref 96–106)
Creatinine, Ser: 0.76 mg/dL (ref 0.57–1.00)
Globulin, Total: 3.2 g/dL (ref 1.5–4.5)
Glucose: 107 mg/dL — ABNORMAL HIGH (ref 70–99)
Potassium: 5.1 mmol/L (ref 3.5–5.2)
Sodium: 140 mmol/L (ref 134–144)
Total Protein: 7.6 g/dL (ref 6.0–8.5)
eGFR: 76 mL/min/{1.73_m2}
